# Patient Record
Sex: Male | Born: 1985 | Race: White | Hispanic: No | Marital: Single | Smoking: Never smoker
Health system: Southern US, Community
[De-identification: ages and names within clinical notes are randomized; demographics above are authoritative.]

## PROBLEM LIST (undated history)

## (undated) DIAGNOSIS — K219 Gastro-esophageal reflux disease without esophagitis: Secondary | ICD-10-CM

## (undated) DIAGNOSIS — M459 Ankylosing spondylitis of unspecified sites in spine: Secondary | ICD-10-CM

## (undated) HISTORY — DX: Ankylosing spondylitis of unspecified sites in spine: M45.9

## (undated) HISTORY — PX: WISDOM TOOTH EXTRACTION: SHX21

---
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
## 2011-08-24 DIAGNOSIS — M549 Dorsalgia, unspecified: Secondary | ICD-10-CM | POA: Insufficient documentation

## 2011-08-24 DIAGNOSIS — R634 Abnormal weight loss: Secondary | ICD-10-CM | POA: Insufficient documentation

## 2013-08-28 DIAGNOSIS — M459 Ankylosing spondylitis of unspecified sites in spine: Secondary | ICD-10-CM | POA: Insufficient documentation

## 2013-08-28 HISTORY — DX: Ankylosing spondylitis of unspecified sites in spine: M45.9

## 2013-09-23 DIAGNOSIS — M76899 Other specified enthesopathies of unspecified lower limb, excluding foot: Secondary | ICD-10-CM | POA: Insufficient documentation

## 2013-11-05 DIAGNOSIS — M25559 Pain in unspecified hip: Secondary | ICD-10-CM | POA: Insufficient documentation

## 2014-01-26 DIAGNOSIS — R1030 Lower abdominal pain, unspecified: Secondary | ICD-10-CM | POA: Insufficient documentation

## 2014-06-11 DIAGNOSIS — M5124 Other intervertebral disc displacement, thoracic region: Secondary | ICD-10-CM | POA: Insufficient documentation

## 2018-06-26 ENCOUNTER — Emergency Department (HOSPITAL_COMMUNITY)
Admission: EM | Admit: 2018-06-26 | Discharge: 2018-06-26 | Disposition: A | Discharge status: Home / Self Care | Payer: Self-pay | Attending: Emergency Medicine | Admitting: Emergency Medicine

## 2018-06-26 ENCOUNTER — Other Ambulatory Visit: Payer: Self-pay

## 2018-06-26 ENCOUNTER — Emergency Department (HOSPITAL_COMMUNITY): Payer: Self-pay

## 2018-06-26 ENCOUNTER — Encounter (HOSPITAL_COMMUNITY): Payer: Self-pay | Admitting: Emergency Medicine

## 2018-06-26 DIAGNOSIS — S61312A Laceration without foreign body of right middle finger with damage to nail, initial encounter: Secondary | ICD-10-CM | POA: Insufficient documentation

## 2018-06-26 DIAGNOSIS — Y998 Other external cause status: Secondary | ICD-10-CM | POA: Insufficient documentation

## 2018-06-26 DIAGNOSIS — Y929 Unspecified place or not applicable: Secondary | ICD-10-CM | POA: Insufficient documentation

## 2018-06-26 DIAGNOSIS — Y9389 Activity, other specified: Secondary | ICD-10-CM | POA: Insufficient documentation

## 2018-06-26 DIAGNOSIS — W312XXA Contact with powered woodworking and forming machines, initial encounter: Secondary | ICD-10-CM | POA: Insufficient documentation

## 2018-06-26 MED ORDER — BACITRACIN ZINC 500 UNIT/GM EX OINT
TOPICAL_OINTMENT | CUTANEOUS | Status: AC
  Filled 2018-06-26: qty 0.9

## 2018-06-26 MED ORDER — MUPIROCIN CALCIUM 2 % EX CREA
TOPICAL_CREAM | Freq: Once | CUTANEOUS | Status: DC
  Filled 2018-06-26: qty 15

## 2018-06-26 MED ORDER — MUPIROCIN CALCIUM 2 % EX CREA: 1.0000 "application " | TOPICAL_CREAM | Freq: Every day | CUTANEOUS | 0 refills | Status: DC

## 2018-06-26 MED ORDER — BACITRACIN ZINC 500 UNIT/GM EX OINT: 1.0000 "application " | TOPICAL_OINTMENT | Freq: Every day | CUTANEOUS | 0 refills | Status: DC

## 2018-06-26 MED ORDER — LIDOCAINE-EPINEPHRINE (PF) 2 %-1:200000 IJ SOLN
10.0000 mL | Freq: Once | INTRAMUSCULAR | Status: AC
  Administered 2018-06-26: 10 mL
  Filled 2018-06-26: qty 20

## 2018-06-26 MED ORDER — BACITRACIN ZINC 500 UNIT/GM EX OINT
TOPICAL_OINTMENT | Freq: Once | CUTANEOUS | Status: AC
  Administered 2018-06-26: 18:00:00 via TOPICAL

## 2018-06-26 MED ORDER — CEPHALEXIN 500 MG PO CAPS: 500.0000 mg | ORAL_CAPSULE | Freq: Two times a day (BID) | ORAL | 0 refills | Status: AC

## 2018-06-26 NOTE — Discharge Instructions (Addendum)
Take antibiotics as prescribed. Take the entire course, even if your symptoms improve.  Wash the wound daily with soap and water. Apply new dressing after washing.,  Use antibiotics ointment when applying a dressing.  Use tylenol or ibuprofen as needed for pain.  Follow up with the hand doctor as needed.  Return to the ER with any new or concerning symptoms.

## 2018-06-26 NOTE — ED Triage Notes (Signed)
Patient here from work with complaints of right middle finger laceration. Bleeding controlled. Reports that he has had a tetanus shot.

## 2018-06-26 NOTE — ED Provider Notes (Signed)
[facility]-EMERGENCY DEPT Provider Note   CSN: [id number] Arrival date & time: 06/26/18  1614     History   Chief Complaint Chief Complaint  Patient presents with  . Laceration    HPI Johnathan Armstrong is a 32 y.o. male presenting for evaluation of finger injury.  Table saw, thought it was turned off and he reached to grab for something, it slipped and cut his right dorsal middle finger.  He this happened approximately an hour prior to arrival.  Minimal bleeding or pain.  She denies numbness or tingling.  No injury elsewhere.  He has no medical problems, takes medications daily.  He is not on blood thinners.  Tetanus is up-to-date.  HPI  History reviewed. No pertinent past medical history.  There are no active problems to display for this patient.   History reviewed. No pertinent surgical history.      Home Medications    Prior to Admission medications   Medication Sig Start Date End Date Taking? Authorizing Provider  acetaminophen (TYLENOL) 500 MG tablet Take 1,000 mg by mouth daily as needed (pain).   Yes [provider]  ibuprofen (ADVIL,MOTRIN) 200 MG tablet Take 400 mg by mouth daily as needed (pain).   Yes [provider]  bacitracin ointment Apply 1 application topically daily. 06/26/18   Ardel Jagger, PA-C  cephALEXin (KEFLEX) 500 MG capsule Take 1 capsule (500 mg total) by mouth 2 (two) times daily for 5 days. 06/26/18 07/01/18  Andra Heslin, PA-C    Family History No family history on file.  Social History Social History   Tobacco Use  . Smoking status: Never Smoker  . Smokeless tobacco: Never Used  Substance Use Topics  . Alcohol use: Never    Frequency: Never  . Drug use: Not on file     Allergies   Patient has no allergy information on record.   Review of Systems Review of Systems  Skin: Positive for wound.  Neurological: Negative for numbness.  Hematological: Does not bruise/bleed easily.      Physical Exam Updated Vital Signs BP (!) 147/93 (BP Location: Left Arm)   Pulse 97   Temp 98 F (36.7 C) (Oral)   Ht 5\' 10"  (1.778 m)   Wt 83.9 kg (185 lb)   SpO2 99%   BMI 26.54 kg/m   Physical Exam  Constitutional: He is oriented to person, place, and time. He appears well-developed and well-nourished. No distress.  HENT:  Head: Normocephalic and atraumatic.  Eyes: EOM are normal.  Neck: Normal range of motion.  Pulmonary/Chest: Effort normal.  Abdominal: He exhibits no distension.  Musculoskeletal: Normal range of motion.  Strength of middle finger intact against resistance.  Full active range of motion of the finger.  Two-point discrimination intact.  Neurological: He is alert and oriented to person, place, and time. No sensory deficit.  Skin: Skin is warm. No rash noted.  Laceration of the dorsal aspect of the right middle finger with nail involvement.  Minimal bleeding.  See picture below  Psychiatric: He has a normal mood and affect.  Nursing note and vitals reviewed.      ED Treatments / Results  Labs (all labs ordered are listed, but only abnormal results are displayed) Labs Reviewed - No data to display  EKG None  Radiology Dg Finger Middle Right  Result Date: 06/26/2018 CLINICAL DATA:  Patient status post right long finger laceration today. Initial encounter. EXAM: RIGHT MIDDLE FINGER 2+V COMPARISON:  None.  FINDINGS: There is no evidence of fracture or dislocation. There is no evidence of arthropathy or other focal bone abnormality. Soft tissues are unremarkable. IMPRESSION: Negative exam. Electronically Signed   By: Drusilla Kannerhomas  Dalessio M.D.   On: 06/26/2018 16:59    Procedures .Nail Removal Date/Time: 06/26/2018 6:15 PM Performed by: Alveria Apleyaccavale, Marwah Disbro, PA-C Authorized by: Alveria Apleyaccavale, Lakshya Mcgillicuddy, PA-C   Consent:    Consent obtained:  Verbal   Consent given by:  Patient   Risks discussed:  Bleeding, incomplete removal, infection, pain and permanent nail  deformity Location:    Hand:  R long finger Pre-procedure details:    Skin preparation:  ChloraPrep   Preparation: Patient was prepped and draped in the usual sterile fashion   Anesthesia (see MAR for exact dosages):    Anesthesia method:  Nerve block   Block location:  Digital block   Block needle gauge:  25 G   Block anesthetic:  Lidocaine 2% WITH epi   Block injection procedure:  Anatomic landmarks identified, introduced needle, incremental injection and negative aspiration for blood   Block outcome:  Anesthesia achieved Nail Removal:    Nail removed:  Complete   Nail bed repaired: yes     Nail bed suture material: 5-0 vicryl rapid.   Number of sutures:  3   Removed nail replaced and anchored: no   Post-procedure details:    Dressing:  Antibiotic ointment and Xeroform gauze   Patient tolerance of procedure:  Tolerated well, no immediate complications   (including critical care time)  Medications Ordered in ED Medications  mupirocin cream (BACTROBAN) 2 % (has no administration in time range)  bacitracin 500 UNIT/GM ointment (has no administration in time range)  bacitracin ointment (has no administration in time range)  lidocaine-EPINEPHrine (XYLOCAINE W/EPI) 2 %-1:200000 (PF) injection 10 mL (10 mLs Infiltration Given 06/26/18 1709)     Initial Impression / Assessment and Plan / ED Course  I have reviewed the triage vital signs and the nursing notes.  Pertinent labs & imaging results that were available during my care of the patient were reviewed by me and considered in my medical decision making (see chart for details).     Patient presenting for evaluation of finger injury.  Physical exam shows patient is neurovascularly intact.  X-ray reviewed and interpreted by me, no bony involvement.  Obvious nailbed involvement and deformity.  Nail removed, and nailbed repaired.  Aftercare instructions given.  Follow-up with hand.  Will give antibiotics for prophylaxis.  At this time,  patient appears safe for discharge.  Return precautions given.  Patient states he understands and agrees to plan.   Final Clinical Impressions(s) / ED Diagnoses   Final diagnoses:  Laceration of right middle finger without foreign body with damage to nail, initial encounter    ED Discharge Orders        Ordered    cephALEXin (KEFLEX) 500 MG capsule  2 times daily     06/26/18 1801    mupirocin cream (BACTROBAN) 2 %  Daily,   Status:  Discontinued     06/26/18 1805    bacitracin ointment  Daily     06/26/18 1807       Faisal Stradling, PA-C 06/26/18 1818    Benjiman CorePickering, Nathan, MD 06/27/18 [phone]

## 2018-08-13 ENCOUNTER — Other Ambulatory Visit: Payer: Self-pay

## 2018-08-13 ENCOUNTER — Encounter: Payer: Self-pay | Admitting: Family Medicine

## 2018-08-13 ENCOUNTER — Ambulatory Visit: Payer: Self-pay | Admitting: Family Medicine

## 2018-08-13 VITALS — BP 126/82 | HR 73 | Temp 98.6°F | Ht 70.0 in | Wt 195.6 lb

## 2018-08-13 DIAGNOSIS — J40 Bronchitis, not specified as acute or chronic: Secondary | ICD-10-CM

## 2018-08-13 DIAGNOSIS — J329 Chronic sinusitis, unspecified: Secondary | ICD-10-CM | POA: Insufficient documentation

## 2018-08-13 MED ORDER — ALBUTEROL SULFATE HFA 108 (90 BASE) MCG/ACT IN AERS
2.0000 | INHALATION_SPRAY | Freq: Four times a day (QID) | RESPIRATORY_TRACT | 0 refills | Status: DC | PRN
Start: 2018-08-13 — End: 2019-02-28

## 2018-08-13 MED ORDER — AMOXICILLIN 500 MG PO CAPS: 500.0000 mg | ORAL_CAPSULE | Freq: Three times a day (TID) | ORAL | 0 refills | Status: DC

## 2018-08-13 MED ORDER — PREDNISONE 20 MG PO TABS: 40.0000 mg | ORAL_TABLET | Freq: Every day | ORAL | 0 refills | Status: AC

## 2018-08-13 NOTE — Addendum Note (Signed)
Addended by: Mammie LorenzoMATTHEWS, Joshwa Hemric E on: 08/13/2018 02:15 PM   Modules accepted: Level of Service

## 2018-08-13 NOTE — Assessment & Plan Note (Signed)
Prolonged symptoms, will treat with amoxicillin.  He will d/c cephalexin Rx for prednisone and albuterol inhaler.  He will let me know if he continues to have worsening symptoms.

## 2018-08-13 NOTE — Patient Instructions (Signed)
How to Use a Metered Dose Inhaler A metered dose inhaler is a handheld device for taking medicine that must be breathed into the lungs (inhaled). The device can be used to deliver a variety of inhaled medicines, including:  Quick relief or rescue medicines, such as bronchodilators.  Controller medicines, such as corticosteroids.  The medicine is delivered by pushing down on a metal canister to release a preset amount of spray and medicine. Each device contains the amount of medicine that is needed for a preset number of uses (inhalations). Your health care provider may recommend that you use a spacer with your inhaler to help you take the medicine more effectively. A spacer is a plastic tube with a mouthpiece on one end and an opening that connects to the inhaler on the other end. A spacer holds the medicine in a tube for a short time, which allows you to inhale more medicine. What are the risks? If you do not use your inhaler correctly, medicine might not reach your lungs to help you breathe. Inhaler medicine can cause side effects, such as:  Mouth or throat infection.  Cough.  Hoarseness.  Headache.  Nausea and vomiting.  Lung infection (pneumonia) in people who have a lung condition called COPD.  How to use a metered dose inhaler without a spacer 1. Remove the cap from the inhaler. 2. If you are using the inhaler for the first time, shake it for 5 seconds, turn it away from your face, then release 4 puffs into the air. This is called priming. 3. Shake the inhaler for 5 seconds. 4. Position the inhaler so the top of the canister faces up. 5. Put your index finger on the top of the medicine canister. Support the bottom of the inhaler with your thumb. 6. Breathe out normally and as completely as possible, away from the inhaler. 7. Either place the inhaler between your teeth and close your lips tightly around the mouthpiece, or hold the inhaler 1-2 inches (2.5-5 cm) away from your open  mouth. Keep your tongue down out of the way. If you are unsure which technique to use, ask your health care provider. 8. Press the canister down with your index finger to release the medicine, then inhale deeply and slowly through your mouth (not your nose) until your lungs are completely filled. Inhaling should take 4-6 seconds. 9. Hold the medicine in your lungs for 5-10 seconds (10 seconds is best). This helps the medicine get into the small airways of your lungs. 10. With your lips in a tight circle (pursed), breathe out slowly. 11. Repeat steps 3-10 until you have taken the number of puffs that your health care provider directed. Wait about 1 minute between puffs or as directed. 12. Put the cap on the inhaler. 13. If you are using a steroid inhaler, rinse your mouth with water, gargle, and spit out the water. Do not swallow the water. How to use a metered dose inhaler with a spacer 1. Remove the cap from the inhaler. 2. If you are using the inhaler for the first time, shake it for 5 seconds, turn it away from your face, then release 4 puffs into the air. This is called priming. 3. Shake the inhaler for 5 seconds. 4. Place the open end of the spacer onto the inhaler mouthpiece. 5. Position the inhaler so the top of the canister faces up and the spacer mouthpiece faces you. 6. Put your index finger on the top of the medicine canister.   Support the bottom of the inhaler and the spacer with your thumb. 7. Breathe out normally and as completely as possible, away from the spacer. 8. Place the spacer between your teeth and close your lips tightly around it. Keep your tongue down out of the way. 9. Press the canister down with your index finger to release the medicine, then inhale deeply and slowly through your mouth (not your nose) until your lungs are completely filled. Inhaling should take 4-6 seconds. 10. Hold the medicine in your lungs for 5-10 seconds (10 seconds is best). This helps the medicine  get into the small airways of your lungs. 11. With your lips in a tight circle (pursed), breathe out slowly. 12. Repeat steps 3-11 until you have taken the number of puffs that your health care provider directed. Wait about 1 minute between puffs or as directed. 13. Remove the spacer from the inhaler and put the cap on the inhaler. 14. If you are using a steroid inhaler, rinse your mouth with water, gargle, and spit out the water. Do not swallow the water. Follow these instructions at home:  Take your inhaled medicine only as told by your health care provider. Do not use the inhaler more than directed by your health care provider.  Keep all follow-up visits as told by your health care provider. This is important.  If your inhaler has a counter, you can check it to determine how full your inhaler is. If your inhaler does not have a counter, ask your health care provider when you will need to refill your inhaler and write the refill date on a calendar or on your inhaler canister. Note that you cannot know when an inhaler is empty by shaking it.  Follow directions on the package insert for care and cleaning of your inhaler and spacer. Contact a health care provider if:  Symptoms are only partially relieved with your inhaler.  You are having trouble using your inhaler.  You have an increase in phlegm.  You have headaches. Get help right away if:  You feel little or no relief after using your inhaler.  You have dizziness.  You have a fast heart rate.  You have chills or a fever.  You have night sweats.  There is blood in your phlegm. Summary  A metered dose inhaler is a handheld device for taking medicine that must be breathed into the lungs (inhaled).  The medicine is delivered by pushing down on a metal canister to release a preset amount of spray and medicine.  Each device contains the amount of medicine that is needed for a preset number of uses (inhalations). This  information is not intended to replace advice given to you by your health care provider. Make sure you discuss any questions you have with your health care provider. Document Released: 12/12/2005 Document Revised: 11/01/2016 Document Reviewed: 11/01/2016 Elsevier Interactive Patient Education  2017 [employer]  

## 2018-08-13 NOTE — Progress Notes (Signed)
Johnathan CutterMatthew B Mccarrell - 32 y.o. male MRN [id number]  Date of birth: May 20, 1986  Subjective Chief Complaint  Patient presents with  . Nasal Congestion    [place] ears were irrigated,but today they are sore headache, sometimes SOB    HPI Johnathan Armstrong is a 32 y.o. male here today with complaint of nasal congestion, sinus pressure, intermittent shortness of breath with wheezing and headache.  Symptoms began a little over 1 week ago. Seen previously at an urgent care while on vacation and diagnosed with cerumen impaction, ears were irrigated however still had some occlusion.  He has not been using debrox regularly.  He is unable to blow anything out or cough anything up.  He has not had fever, chills, vision change, nausea or vomiting, or myalgias.  He did start keflex which was prescribed for a finger injury in hopes that this would help his symptoms.  He is not taking anything else at this time.   ROS:  A comprehensive ROS was completed and negative except as noted per HPI  No Known Allergies  Past Medical History:  Diagnosis Date  . Ankylosing spondylitis (HCC) 08/28/2013    History reviewed. No pertinent surgical history.  Social History   Socioeconomic History  . Marital status: Single    Spouse name: Not on file  . Number of children: Not on file  . Years of education: Not on file  . Highest education level: Not on file  Occupational History  . Not on file  Social Needs  . Financial resource strain: Not on file  . Food insecurity:    Worry: Not on file    Inability: Not on file  . Transportation needs:    Medical: Not on file    Non-medical: Not on file  Tobacco Use  . Smoking status: Never Smoker  . Smokeless tobacco: Never Used  Substance and Sexual Activity  . Alcohol use: Never    Frequency: Never  . Drug use: Not on file  . Sexual activity: Not on file  Lifestyle  . Physical activity:    Days per week: Not on file    Minutes  per session: Not on file  . Stress: Not on file  Relationships  . Social connections:    Talks on phone: Not on file    Gets together: Not on file    Attends religious service: Not on file    Active member of club or organization: Not on file    Attends meetings of clubs or organizations: Not on file    Relationship status: Not on file  Other Topics Concern  . Not on file  Social History Narrative  . Not on file    History reviewed. No pertinent family history.  Health Maintenance  Topic Date Due  . HIV Screening  07/12/2000  . INFLUENZA VACCINE  07/26/2018  . TETANUS/TDAP  08/28/2024    ----------------------------------------------------------------------------------------------------------------------------------------------------------------------------------------------------------------- Physical Exam BP 126/82 (BP Location: Left Arm, Patient Position: Sitting, Cuff Size: Normal)   Pulse 73   Temp 98.6 F (37 C) (Oral)   Ht 5\' 10"  (1.778 m)   Wt 195 lb 9.6 oz (88.7 kg)   SpO2 97%   BMI 28.07 kg/m   Physical Exam  Constitutional: He is oriented to person, place, and time. He appears well-nourished. No distress.  HENT:  Head: Normocephalic and atraumatic.  Mouth/Throat: Oropharynx is clear and moist.  Mild maxillary sinus tenderness  EAC impacted with  dark, dry cerumen.   Eyes: No scleral icterus.  Neck: Neck supple. No thyromegaly present.  Cardiovascular: Normal rate, regular rhythm and normal heart sounds.  Pulmonary/Chest: Effort normal and breath sounds normal.  Musculoskeletal: He exhibits no edema.  Lymphadenopathy:    He has no cervical adenopathy.  Neurological: He is alert and oriented to person, place, and time. No cranial nerve deficit.  Skin: No rash noted.  Psychiatric: He has a normal mood and affect. His behavior is normal.     ------------------------------------------------------------------------------------------------------------------------------------------------------------------------------------------------------------------- Assessment and Plan  Sinobronchitis Prolonged symptoms, will treat with amoxicillin.  He will d/c cephalexin Rx for prednisone and albuterol inhaler.  He will let me know if he continues to have worsening symptoms.

## 2018-09-13 ENCOUNTER — Encounter: Payer: Self-pay | Admitting: Family Medicine

## 2019-02-28 ENCOUNTER — Ambulatory Visit (INDEPENDENT_AMBULATORY_CARE_PROVIDER_SITE_OTHER): Payer: PRIVATE HEALTH INSURANCE | Admitting: Family Medicine

## 2019-02-28 ENCOUNTER — Encounter: Payer: Self-pay | Admitting: Family Medicine

## 2019-02-28 VITALS — BP 144/78 | HR 75 | Temp 97.6°F | Ht 70.0 in | Wt 193.4 lb

## 2019-02-28 DIAGNOSIS — R002 Palpitations: Secondary | ICD-10-CM | POA: Diagnosis not present

## 2019-02-28 DIAGNOSIS — J0100 Acute maxillary sinusitis, unspecified: Secondary | ICD-10-CM

## 2019-02-28 DIAGNOSIS — R42 Dizziness and giddiness: Secondary | ICD-10-CM | POA: Diagnosis not present

## 2019-02-28 MED ORDER — AMOXICILLIN-POT CLAVULANATE 875-125 MG PO TABS: 1.0000 | ORAL_TABLET | Freq: Two times a day (BID) | ORAL | 0 refills | Status: DC

## 2019-02-28 MED ORDER — FLUTICASONE PROPIONATE 50 MCG/ACT NA SUSP: 2.0000 | Freq: Every day | NASAL | 2 refills | Status: DC

## 2019-02-28 NOTE — Progress Notes (Signed)
Johnathan Armstrong - 33 y.o. male MRN [id number]  Date of birth: 13-Mar-1986  Subjective Chief Complaint  Patient presents with  . Sinusitis    Patient is here today C/O sinus pressure.  Pressure is maxillary and severe in nature. He feels that he can't get a full deep breath.  Non productive cough. Slight dizziness and feelings of being flushed.  Sx started 2-3 days ago.  Denies any body aches. He has been Tx with Afrin for nasal congestion. Had to lay down for pressure in the back of his head and feeling like neck is warm. Urinated 3-4 times in the past hour.    HPI Johnathan Armstrong is a 33 y.o. male here today with complaint of R sided maxillary sinus pressure as well as some dizziness.  Describes dizziness as sensation that room is spinning.  He reports that symptoms started 2-3 days ago and have not improved since that time.  He reports fullness in his R ear and behind his eyes.  He has not fever, chills, chest pain, wheezing, body aches.  He is currently using afrin and has been using for the past week.   He does have sensation of not being able to fully take a deep breath at times as well as sensation of palpitations but this has been going on intermittently for several months.   Admits to some reflux symptoms as well as increased belching.  Also has had tightness in neck for several months.  Denies radiation/weakness into extremities.  Denies increased stress.  He does do a lot of work that requires him to look up and work overhead for long periods.   He was seen at a hospital in [place] for this last year and reports normal EKG and lab testing at that time.   ROS:  A comprehensive ROS was completed and negative except as noted per HPI    No Known Allergies  Past Medical History:  Diagnosis Date  . Ankylosing spondylitis (HCC) 08/28/2013    No past surgical history on file.  Social History   Socioeconomic History  . Marital status: Single    Spouse name: Not on file  . Number of  children: Not on file  . Years of education: Not on file  . Highest education level: Not on file  Occupational History  . Not on file  Social Needs  . Financial resource strain: Not on file  . Food insecurity:    Worry: Not on file    Inability: Not on file  . Transportation needs:    Medical: Not on file    Non-medical: Not on file  Tobacco Use  . Smoking status: Never Smoker  . Smokeless tobacco: Never Used  Substance and Sexual Activity  . Alcohol use: Yes    Frequency: Never  . Drug use: Never  . Sexual activity: Not on file  Lifestyle  . Physical activity:    Days per week: Not on file    Minutes per session: Not on file  . Stress: Not on file  Relationships  . Social connections:    Talks on phone: Not on file    Gets together: Not on file    Attends religious service: Not on file    Active member of club or organization: Not on file    Attends meetings of clubs or organizations: Not on file    Relationship status: Not on file  Other Topics Concern  . Not on file  Social History Narrative  .  Not on file    Family History  Problem Relation Age of Onset  . Cancer Mother   . Hypertension Mother   . Cancer Father   . Depression Sister   . Asthma Maternal Grandmother   . Hyperlipidemia Maternal Grandmother   . Diabetes Maternal Grandmother   . Hypertension Maternal Grandfather   . Hypertension Paternal Grandmother   . Stroke Paternal Grandmother   . Diabetes Paternal Grandfather     Health Maintenance  Topic Date Due  . INFLUENZA VACCINE  03/26/2019 (Originally 07/26/2018)  . HIV Screening  02/28/2020 (Originally 07/12/2000)  . TETANUS/TDAP  08/28/2024    ----------------------------------------------------------------------------------------------------------------------------------------------------------------------------------------------------------------- Physical Exam BP (!) 144/78 (BP Location: Right Arm, Cuff Size: Normal)   Pulse 75   Temp  97.6 F (36.4 C) (Oral)   Ht 5\' 10"  (1.778 m)   Wt 193 lb 6.4 oz (87.7 kg)   SpO2 99%   BMI 27.75 kg/m   Physical Exam Constitutional:      Appearance: Normal appearance.  HENT:     Head: Normocephalic and atraumatic.     Left Ear: Tympanic membrane and ear canal normal.     Ears:     Comments: Serous effusion to R ear     Nose: Congestion present.     Mouth/Throat:     Mouth: Mucous membranes are moist.  Eyes:     General: No scleral icterus. Neck:     Musculoskeletal: Neck supple.  Cardiovascular:     Rate and Rhythm: Normal rate and regular rhythm.     Heart sounds: Normal heart sounds.  Pulmonary:     Effort: Pulmonary effort is normal.     Breath sounds: Normal breath sounds.  Neurological:     General: No focal deficit present.  Psychiatric:        Mood and Affect: Mood normal.        Behavior: Behavior normal.     ------------------------------------------------------------------------------------------------------------------------------------------------------------------------------------------------------------------- Assessment and Plan  Palpitations -Occurs rarely.  -Reports normal EKG last year in ED -Check labs including TSH, CBC and BMP -? Underlying anxiety with muscle tension of neck as well or esophageal spasm with increased reflux belching.  Recommend trial of OTC prilosec initally.  If not improving we can consider H. Pylori testing.    Acute maxillary sinusitis -Start augmentin -Discussed discontinuing afrin at this time -Add on flonase to help with congestion, middle ear effusion.  I think this is likely contributing to his dizziness.

## 2019-02-28 NOTE — Assessment & Plan Note (Signed)
-  Start augmentin -Discussed discontinuing afrin at this time -Add on flonase to help with congestion, middle ear effusion.  I think this is likely contributing to his dizziness.

## 2019-02-28 NOTE — Assessment & Plan Note (Addendum)
-  Occurs rarely.  -Reports normal EKG last year in ED -Check labs including TSH, CBC and BMP -? Underlying anxiety with muscle tension of neck as well or esophageal spasm with increased reflux belching.  Recommend trial of OTC prilosec initally.  If not improving we can consider H. Pylori testing.

## 2019-02-28 NOTE — Patient Instructions (Signed)

## 2019-03-01 LAB — BASIC METABOLIC PANEL
BUN: 16 mg/dL (ref 6–23)
CALCIUM: 10.1 mg/dL (ref 8.4–10.5)
CHLORIDE: 103 meq/L (ref 96–112)
CO2: 25 mEq/L (ref 19–32)
CREATININE: 0.97 mg/dL (ref 0.40–1.50)
GFR: 88.79 mL/min (ref >60.00)
Glucose, Bld: 84 mg/dL (ref 70–99)
Potassium: 3.6 mEq/L (ref 3.5–5.1)
Sodium: 138 mEq/L (ref 135–145)

## 2019-03-01 LAB — CBC
HCT: 44.6 % (ref 39.0–52.0)
Hemoglobin: 15.2 g/dL (ref 13.0–17.0)
MCHC: 34.1 g/dL (ref 30.0–36.0)
MCV: 83.1 fl (ref 78.0–100.0)
Platelets: 254 10*3/uL (ref 150.0–400.0)
RBC: 5.37 Mil/uL (ref 4.22–5.81)
RDW: 12.7 % (ref 11.5–15.5)
WBC: 8.2 10*3/uL (ref 4.0–10.5)

## 2019-03-01 LAB — TSH: TSH: 1.5 u[IU]/mL (ref 0.35–4.50)

## 2019-03-04 ENCOUNTER — Telehealth: Payer: Self-pay

## 2019-03-04 NOTE — Telephone Encounter (Signed)
All labs are normal. 

## 2019-03-04 NOTE — Telephone Encounter (Signed)
Copied from CRM [id number]. Topic: General - Other >> Mar 04, 2019  3:31 PM Floria Raveling A wrote: Reason for CRM: Pt wife called in and would like some one to give pt a call with the results of the lab work pt had done on 3/5  Best number  [phone]

## 2019-03-05 NOTE — Telephone Encounter (Signed)
I spoke with pt about his results and CM he wanted to let you know he has noticed that he woke up with nausea. He states he did eat prior to taking his antibiotic this morning but he did take it with milk last night but unsure if that triggered it.

## 2019-03-05 NOTE — Telephone Encounter (Signed)
Recommend continuing.  Avoid dairy products with antibiotic.  Let me know if symptoms continue.

## 2019-03-06 ENCOUNTER — Encounter: Payer: Self-pay | Admitting: Family Medicine

## 2019-03-06 ENCOUNTER — Ambulatory Visit (INDEPENDENT_AMBULATORY_CARE_PROVIDER_SITE_OTHER): Payer: PRIVATE HEALTH INSURANCE | Admitting: Family Medicine

## 2019-03-06 ENCOUNTER — Other Ambulatory Visit: Payer: Self-pay

## 2019-03-06 VITALS — BP 132/78 | HR 90 | Temp 98.0°F | Ht 70.0 in | Wt 192.0 lb

## 2019-03-06 DIAGNOSIS — R002 Palpitations: Secondary | ICD-10-CM | POA: Diagnosis not present

## 2019-03-06 MED ORDER — LORAZEPAM 0.5 MG PO TABS: 0.5000 mg | ORAL_TABLET | Freq: Two times a day (BID) | ORAL | 1 refills | Status: DC | PRN

## 2019-03-06 NOTE — Progress Notes (Signed)
Johnathan Armstrong - 33 y.o. male MRN [id number]  Date of birth: 04-22-1986  Subjective Chief Complaint  Patient presents with  . Follow-up    pt states he has been having off and on tingling in his arms, and heart racing, and shaky in his body     HPI Johnathan Armstrong is a 33 y.o. male here today with complaint of palpitations/racing heart.  He reports that he has this a few times per week. Has some associated feelings of numbness/tingling in his arms and hands and shaky feeling.  He reports intermittent feelings of dizziness as well.  Some feeling of epigastric pain and nausea. He denies chest pain or pre-syncopal symptoms.  He has not had shortness of breath, palpitations, fever, chills, headache, blood in stool.  Recent labs including TSH, CBC and metabolic panel were negative.  Doesn't think he has felt particularly anxious.    ROS:  A comprehensive ROS was completed and negative except as noted per HPI  No Known Allergies  Past Medical History:  Diagnosis Date  . Ankylosing spondylitis (HCC) 08/28/2013    No past surgical history on file.  Social History   Socioeconomic History  . Marital status: Single    Spouse name: Not on file  . Number of children: Not on file  . Years of education: Not on file  . Highest education level: Not on file  Occupational History  . Not on file  Social Needs  . Financial resource strain: Not on file  . Food insecurity:    Worry: Not on file    Inability: Not on file  . Transportation needs:    Medical: Not on file    Non-medical: Not on file  Tobacco Use  . Smoking status: Never Smoker  . Smokeless tobacco: Never Used  Substance and Sexual Activity  . Alcohol use: Yes    Frequency: Never  . Drug use: Never  . Sexual activity: Not on file  Lifestyle  . Physical activity:    Days per week: Not on file    Minutes per session: Not on file  . Stress: Not on file  Relationships  . Social connections:    Talks on phone: Not on file   Gets together: Not on file    Attends religious service: Not on file    Active member of club or organization: Not on file    Attends meetings of clubs or organizations: Not on file    Relationship status: Not on file  Other Topics Concern  . Not on file  Social History Narrative  . Not on file    Family History  Problem Relation Age of Onset  . Cancer Mother   . Hypertension Mother   . Cancer Father   . Depression Sister   . Asthma Maternal Grandmother   . Hyperlipidemia Maternal Grandmother   . Diabetes Maternal Grandmother   . Hypertension Maternal Grandfather   . Hypertension Paternal Grandmother   . Stroke Paternal Grandmother   . Diabetes Paternal Grandfather     Health Maintenance  Topic Date Due  . INFLUENZA VACCINE  03/26/2019 (Originally 07/26/2018)  . HIV Screening  02/28/2020 (Originally 07/12/2000)  . TETANUS/TDAP  08/28/2024    ----------------------------------------------------------------------------------------------------------------------------------------------------------------------------------------------------------------- Physical Exam BP 132/78   Pulse 90   Temp 98 F (36.7 C) (Oral)   Ht 5\' 10"  (1.778 m)   Wt 192 lb (87.1 kg)   SpO2 98%   BMI 27.55 kg/m   Physical Exam Constitutional:  Appearance: Normal appearance.  HENT:     Head: Normocephalic and atraumatic.     Mouth/Throat:     Mouth: Mucous membranes are moist.  Eyes:     General: No scleral icterus. Neck:     Musculoskeletal: Neck supple.  Cardiovascular:     Rate and Rhythm: Normal rate and regular rhythm.     Heart sounds: Normal heart sounds.  Pulmonary:     Effort: Pulmonary effort is normal.     Breath sounds: Normal breath sounds.  Abdominal:     General: Abdomen is flat. There is no distension.     Palpations: Abdomen is soft.     Tenderness: There is no abdominal tenderness.  Lymphadenopathy:     Cervical: No cervical adenopathy.  Skin:    General: Skin  is warm and dry.  Neurological:     General: No focal deficit present.     Mental Status: He is alert.  Psychiatric:        Mood and Affect: Mood normal.        Behavior: Behavior normal.     ------------------------------------------------------------------------------------------------------------------------------------------------------------------------------------------------------------------- Assessment and Plan  Palpitations -EKG reviewed, incomplete RBBB otherwise normal.   -Symptoms suggestive of panic attack, will give trial of lorazepam -We did discuss referral to cardiology if this continues and no relief with lorazepam.

## 2019-03-06 NOTE — Telephone Encounter (Signed)
Called and spoke with pt to inform him of Dr. Anastasio Auerbach message. Per pt he was going to call us for an appt due to him not feeling better, he states last night his body was throbbing and his bp has been running slightly elevated and he just is really concerned. CM gave verbal ok for pt to come this afternoon for an appt. Pt's appt was scheduled.  Nothing further is needed

## 2019-03-06 NOTE — Patient Instructions (Signed)
Panic Attack  A panic attack is a sudden episode of severe anxiety, fear, or discomfort that causes physical and emotional symptoms. The attack may be in response to something frightening, or it may occur for no known reason.  Symptoms of a panic attack can be similar to symptoms of a heart attack or stroke. It is important to see your health care provider when you have a panic attack so that these conditions can be ruled out.  A panic attack is a symptom of another condition. Most panic attacks go away with treatment of the underlying problem. If you have panic attacks often, you may have a condition called panic disorder.  What are the causes?  A panic attack may be caused by:  · An extreme, life-threatening situation, such as a war or natural disaster.  · An anxiety disorder, such as post-traumatic stress disorder.  · Depression.  · Certain medical conditions, including heart problems, neurological conditions, and infections.  · Certain over-the-counter and prescription medicines.  · Illegal drugs that increase heart rate and blood pressure, such as methamphetamine.  · Alcohol.  · Supplements that increase anxiety.  · Panic disorder.  What increases the risk?  You are more likely to develop this condition if:  · You have an anxiety disorder.  · You have another mental health condition.  · You take certain medicines.  · You use alcohol, illegal drugs, or other substances.  · You are under extreme stress.  · A life event is causing increased feelings of anxiety and depression.  What are the signs or symptoms?  A panic attack starts suddenly, usually lasts about 20 minutes, and occurs with one or more of the following:  · A pounding heart.  · A feeling that your heart is beating irregularly or faster than normal (palpitations).  · Sweating.  · Trembling or shaking.  · Shortness of breath or feeling smothered.  · Feeling choked.  · Chest pain or discomfort.  · Nausea or a strange feeling in your  stomach.  · Dizziness, feeling lightheaded, or feeling like you might faint.  · Chills or hot flashes.  · Numbness or tingling in your lips, hands, or feet.  · Feeling confused, or feeling that you are not yourself.  · Fear of losing control or being emotionally unstable.  · Fear of dying.  How is this diagnosed?  A panic attack is diagnosed with an assessment by your health care provider. During the assessment your health care provider will ask questions about:  · Your history of anxiety, depression, and panic attacks.  · Your medical history.  · Whether you drink alcohol, use illegal drugs, take supplements, or take medicines. Be honest about your substance use.  Your health care provider may also:  · Order blood tests or other kinds of tests to rule out serious medical conditions.  · Refer you to a mental health professional for further evaluation.  How is this treated?  Treatment depends on the cause of the panic attack:  · If the cause is a medical problem, your health care provider will either treat that problem or refer you to a specialist.  · If the cause is emotional, you may be given anti-anxiety medicines or referred to a counselor. These medicines may reduce how often attacks happen, reduce how severe the attacks are, and lower anxiety.  · If the cause is a medicine, your health care provider may tell you to stop the medicine, change your dose, or   take a different medicine.  · If the cause is a drug, treatment may involve letting the drug wear off and taking medicine to help the drug leave your body or to counteract its effects. Attacks caused by drug abuse may continue even if you stop using the drug.  Follow these instructions at home:  · Take over-the-counter and prescription medicines only as told by your health care provider.  · If you feel anxious, limit your caffeine intake.  · Take good care of your physical and mental health by:  ? Eating a balanced diet that includes plenty of fresh fruits and  vegetables, whole grains, lean meats, and low-fat dairy.  ? Getting plenty of rest. Try to get 7-8 hours of uninterrupted sleep each night.  ? Exercising regularly. Try to get 30 minutes of physical activity at least 5 days a week.  ? Not smoking. Talk to your health care provider if you need help quitting.  ? Limiting alcohol intake to no more than 1 drink a day for nonpregnant women and 2 drinks a day for men. One drink equals 12 oz of beer, 5 oz of wine, or 1½ oz of hard liquor.  · Keep all follow-up visits as told by your health care provider. This is important. Panic attacks may have underlying physical or emotional problems that take time to accurately diagnose.  Contact a health care provider if:  · Your symptoms do not improve, or they get worse.  · You are not able to take your medicine as prescribed because of side effects.  Get help right away if:  · You have serious thoughts about hurting yourself or others.  · You have symptoms of a panic attack. Do not drive yourself to the hospital. Have someone else drive you or call an ambulance.  If you ever feel like you may hurt yourself or others, or you have thoughts about taking your own life, get help right away. You can go to your nearest emergency department or call:  · Your local emergency services (911 in the [place]  · A suicide crisis helpline, such as the National Suicide Prevention Lifeline at 1-800-273-8255. This is open 24 hours a day.  Summary  · A panic attack is a sign of a serious health or mental health condition. Get help right away. Do not drive yourself to the hospital. Have someone else drive you or call an ambulance.  · Always see a health care provider to have the reasons for the panic attack correctly diagnosed.  · If your panic attack was caused by a physical problem, follow your health care provider's suggestions for medicine, referral to a specialist, and lifestyle changes.  · If your panic attack was caused by an emotional problem,  follow through with counseling from a qualified mental health specialist.  · If you feel like you may hurt yourself or others, call 911 and get help right away.  This information is not intended to replace advice given to you by your health care provider. Make sure you discuss any questions you have with your health care provider.  Document Released: 12/12/2005 Document Revised: 01/20/2017 Document Reviewed: 01/20/2017  Elsevier Interactive Patient Education © 2019 [employer]

## 2019-03-07 ENCOUNTER — Telehealth: Payer: Self-pay | Admitting: Family Medicine

## 2019-03-07 DIAGNOSIS — R002 Palpitations: Secondary | ICD-10-CM

## 2019-03-07 NOTE — Telephone Encounter (Signed)
Copied from CRM [id number]. Topic: Referral - Request for Referral >> Mar 07, 2019  1:30 PM Maia Petties wrote: Has patient seen PCP for this complaint? Yes.   *If NO, is insurance requiring patient see PCP for this issue before PCP can refer them? Referral for which specialty: Cardiology Preferred provider/office: pt said where recommended Reason for referral: pt said he discussed during OV 03/06/2019 with Dr. Matthews/palpatations

## 2019-03-07 NOTE — Telephone Encounter (Signed)
Orders entered

## 2019-03-08 ENCOUNTER — Other Ambulatory Visit: Payer: Self-pay

## 2019-03-08 ENCOUNTER — Other Ambulatory Visit (INDEPENDENT_AMBULATORY_CARE_PROVIDER_SITE_OTHER): Payer: PRIVATE HEALTH INSURANCE

## 2019-03-08 ENCOUNTER — Other Ambulatory Visit: Payer: Self-pay | Admitting: Family Medicine

## 2019-03-08 ENCOUNTER — Telehealth: Payer: Self-pay

## 2019-03-08 ENCOUNTER — Telehealth: Payer: Self-pay | Admitting: Family Medicine

## 2019-03-08 DIAGNOSIS — K219 Gastro-esophageal reflux disease without esophagitis: Secondary | ICD-10-CM | POA: Diagnosis not present

## 2019-03-08 NOTE — Telephone Encounter (Signed)
Pt will come get the kit from the lab.

## 2019-03-08 NOTE — Telephone Encounter (Signed)
We can do this here, confirmed we have kits.  Orders entered.  Can come in for lab visit to have this completed.

## 2019-03-08 NOTE — Telephone Encounter (Signed)
spoke with patient about not eating and/or drinking anything prior to coming in (1hr) for [employer] test. Also told him that he would need to stay for at least 15-20 minutes for this test.

## 2019-03-08 NOTE — Telephone Encounter (Signed)
Patient came by asking about gettingo a H-pylori test done here. He is not sure if he could get it done here. Please advise. Please give patient a call at [phone]

## 2019-03-10 ENCOUNTER — Encounter: Payer: Self-pay | Admitting: Family Medicine

## 2019-03-10 NOTE — Assessment & Plan Note (Signed)
-  EKG reviewed, incomplete RBBB otherwise normal.   -Symptoms suggestive of panic attack, will give trial of lorazepam -We did discuss referral to cardiology if this continues and no relief with lorazepam.

## 2019-03-11 LAB — H. PYLORI BREATH TEST: H. PYLORI BREATH TEST: NOT DETECTED

## 2019-03-12 ENCOUNTER — Telehealth: Payer: Self-pay | Admitting: Family Medicine

## 2019-03-12 NOTE — Telephone Encounter (Signed)
Copied from CRM [id number]. Topic: Quick Communication - See Telephone Encounter >> Mar 12, 2019  4:27 PM Arlyss Gandy, NT wrote: CRM for notification. See Telephone encounter for: 03/12/19. Pt would like a call to discuss his EKG test results.

## 2019-03-13 NOTE — Telephone Encounter (Signed)
Reviewed EKG from [facility] and current EKG appears unchanged compared to previous EKG.  What other questions does he have?

## 2019-03-13 NOTE — Telephone Encounter (Signed)
Called Pt. Test results were not listed. "Just got an email for EKG , but no results" , Gave results, Pt had npo further questions.

## 2019-03-31 ENCOUNTER — Encounter: Payer: Self-pay | Admitting: Family Medicine

## 2019-04-03 ENCOUNTER — Ambulatory Visit (INDEPENDENT_AMBULATORY_CARE_PROVIDER_SITE_OTHER): Payer: PRIVATE HEALTH INSURANCE | Admitting: Family Medicine

## 2019-04-03 ENCOUNTER — Encounter: Payer: Self-pay | Admitting: Family Medicine

## 2019-04-03 VITALS — BP 121/80 | HR 65 | Temp 97.6°F | Wt 189.0 lb

## 2019-04-03 DIAGNOSIS — M542 Cervicalgia: Secondary | ICD-10-CM

## 2019-04-03 DIAGNOSIS — R002 Palpitations: Secondary | ICD-10-CM

## 2019-04-03 DIAGNOSIS — R0789 Other chest pain: Secondary | ICD-10-CM | POA: Diagnosis not present

## 2019-04-03 NOTE — Progress Notes (Signed)
Johnathan Armstrong - 33 y.o. male MRN [id number]  Date of birth: 1986-09-05   This visit type was conducted due to national recommendations for restrictions regarding the COVID-19 Pandemic (e.g. social distancing).  This format is felt to be most appropriate for this patient at this time.  All issues noted in this document were discussed and addressed.  No physical exam was performed (except for noted visual exam findings with Video Visits).  I discussed the limitations of evaluation and management by telemedicine and the availability of in person appointments. The patient expressed understanding and agreed to proceed.  I connected with@ on 04/03/19 at  1:30 PM EDT by a video enabled telemedicine application and verified that I am speaking with the correct person using two identifiers.   Patient Location: Home [address] [place] [address]   Provider location:   Home Office  Chief Complaint  Patient presents with  . Follow-up    HTN, pain in shoulders/upper back -    HPI  Johnathan Armstrong is a 33 y.o. male who presents via audio/video conferencing for a telehealth visit today.  He is following up for palpitations and chest tightness.  He reports that he continue to have episodes of this.  Once again describes as intermittent feeling of palpitations and also having chest tightness that radiates around collar bones.  Also describes sensation of numbness and tingling into arms.  This is worse while sleeping.  He denies any weakness.  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 He does also have a history of ankylosing spondylitis however denies any other joint swelling or stiffness.   He still has sensation of palpitations at times however thinks this may be related to spasm as well.    ROS:  A comprehensive ROS was completed and negative except as noted per HPI  Past Medical History:  Diagnosis Date  . Ankylosing spondylitis (HCC) 08/28/2013    No past surgical  history on file.  Family History  Problem Relation Age of Onset  . Cancer Mother   . Hypertension Mother   . Cancer Father   . Depression Sister   . Asthma Maternal Grandmother   . Hyperlipidemia Maternal Grandmother   . Diabetes Maternal Grandmother   . Hypertension Maternal Grandfather   . Hypertension Paternal Grandmother   . Stroke Paternal Grandmother   . Diabetes Paternal Grandfather     Social History   Socioeconomic History  . Marital status: Single    Spouse name: Not on file  . Number of children: Not on file  . Years of education: Not on file  . Highest education level: Not on file  Occupational History  . Not on file  Social Needs  . Financial resource strain: Not on file  . Food insecurity:    Worry: Not on file    Inability: Not on file  . Transportation needs:    Medical: Not on file    Non-medical: Not on file  Tobacco Use  . Smoking status: Never Smoker  . Smokeless tobacco: Never Used  Substance and Sexual Activity  . Alcohol use: Yes    Frequency: Never  . Drug use: Never  . Sexual activity: Not on file  Lifestyle  . Physical activity:    Days per week: Not on file    Minutes per session: Not on file  . Stress: Not on file  Relationships  . Social connections:    Talks on phone: Not  on file    Gets together: Not on file    Attends religious service: Not on file    Active member of club or organization: Not on file    Attends meetings of clubs or organizations: Not on file    Relationship status: Not on file  . Intimate partner violence:    Fear of current or ex partner: Not on file    Emotionally abused: Not on file    Physically abused: Not on file    Forced sexual activity: Not on file  Other Topics Concern  . Not on file  Social History Narrative  . Not on file     Current Outpatient Medications:  .  fluticasone (FLONASE) 50 MCG/ACT nasal spray, Place 2 sprays into both nostrils daily., Disp: 16 g, Rfl: 2 .  loratadine  (CLARITIN) 10 MG tablet, Take 10 mg by mouth daily., Disp: , Rfl:  .  LORazepam (ATIVAN) 0.5 MG tablet, Take 1 tablet (0.5 mg total) by mouth 2 (two) times daily as needed for anxiety., Disp: 15 tablet, Rfl: 1 .  amoxicillin-clavulanate (AUGMENTIN) 875-125 MG tablet, Take 1 tablet by mouth 2 (two) times daily. (Patient not taking: Reported on 04/03/2019), Disp: 20 tablet, Rfl: 0  EXAM:  VITALS per patient if applicable: BP 121/80 Comment: Pt reports from home  Pulse 65 Comment: pt reports from hm  Temp 97.6 F (36.4 C) (Oral) Comment: pt reports from hm  Wt 189 lb (85.7 kg) Comment: Pt reports from home  BMI 27.12 kg/m   GENERAL: alert, oriented, appears well and in no acute distress  HEENT: atraumatic, conjunttiva clear, no obvious abnormalities on inspection of external nose and ears  NECK: normal movements of the head and neck  LUNGS: on inspection no signs of respiratory distress, breathing rate appears normal, no obvious gross SOB, gasping or wheezing  CV: no obvious cyanosis  MS: moves all visible extremities without noticeable abnormality  PSYCH/NEURO: pleasant and cooperative, no obvious depression or anxiety, speech and thought processing grossly intact  ASSESSMENT AND PLAN:  Discussed the following assessment and plan:  Chest wall pain -Continues to have pain in back, shoulders and chest wall.  Reports having CT of neck recently at outside hospital, will request records. Differential also includes thoracic outlet syndrome although does not seem to have vascular symptom that would go along with this.  -Also with history of thoracic disc disease that may be contributing.  -No joint swelling to suggest additional auto-immune process.  -Discussed nerve conduction study as well once records reviewed.    Palpitations -Stable.  Ekg reassuring.  Has been referred to cardiology.    >25 minutes spent with patient with 50% of time spent counseling patient and coordination of  care regarding his ongoing symptoms as outlined above.     I discussed the assessment and treatment plan with the patient. The patient was provided an opportunity to ask questions and all were answered. The patient agreed with the plan and demonstrated an understanding of the instructions.   The patient was advised to call back or seek an in-person evaluation if the symptoms worsen or if the condition fails to improve as anticipated.     Everrett Coombeody Sherrelle Prochazka, DO

## 2019-04-03 NOTE — Assessment & Plan Note (Addendum)
-  Continues to have pain in back, shoulders and chest wall.  Reports having CT of neck recently at outside hospital, will request records. Differential also includes thoracic outlet syndrome although does not seem to have vascular symptom that would go along with this.  -Also with history of thoracic disc disease that may be contributing.  -No joint swelling to suggest additional auto-immune process.  -Discussed nerve conduction study as well once records reviewed.

## 2019-04-03 NOTE — Assessment & Plan Note (Signed)
-  Stable.  Ekg reassuring.  Has been referred to cardiology.

## 2019-04-04 NOTE — Progress Notes (Signed)
[facility] Records [phone], Fx [phone]) . Called Pt to get fax number to fax [phone]) over EMR release form . Waiting on return fax sheet from Pt. Will fax medical records to request CT of chest/C-spine.

## 2019-04-08 ENCOUNTER — Encounter: Payer: Self-pay | Admitting: Cardiovascular Disease

## 2019-04-08 ENCOUNTER — Telehealth (INDEPENDENT_AMBULATORY_CARE_PROVIDER_SITE_OTHER): Payer: PRIVATE HEALTH INSURANCE | Admitting: Cardiovascular Disease

## 2019-04-08 ENCOUNTER — Telehealth: Payer: Self-pay | Admitting: Cardiovascular Disease

## 2019-04-08 ENCOUNTER — Other Ambulatory Visit: Payer: Self-pay

## 2019-04-08 DIAGNOSIS — R0789 Other chest pain: Secondary | ICD-10-CM

## 2019-04-08 DIAGNOSIS — R002 Palpitations: Secondary | ICD-10-CM

## 2019-04-08 DIAGNOSIS — R0609 Other forms of dyspnea: Secondary | ICD-10-CM

## 2019-04-08 NOTE — Patient Instructions (Addendum)
Medication Instructions:  Your physician recommends that you continue on your current medications as directed. Please refer to the Current Medication list given to you today.  Increase your intake of fluids (water with electrolyte tabs like Nun tablets, or gatorade) , protein ( hard boiled eggs, chicken, fish) , and a electrolytes ( V-8 juice, salt, potassium chloride  which is sold as No-Salt   Lab work: None Ordered   Testing/Procedures: None Ordered   Follow-Up: At [facility], you and your health needs are our priority.  As part of our continuing mission to provide you with exceptional heart care, we have created designated Provider Care Teams.  These Care Teams include your primary Cardiologist (physician) and Advanced Practice Providers (APPs -  Physician Assistants and Nurse Practitioners) who all work together to provide you with the care you need, when you need it. You will need a follow up appointment in:  3 months on Wednesda August 19 at 9:00 am with Dr. Elease Hashimoto. You may also see one of the following Advanced Practice Providers on your designated Care Team in the future: Tereso Newcomer, PA-C [employer], PA-C . Berton Bon, NP

## 2019-04-08 NOTE — Telephone Encounter (Signed)
Spoke to the patient and scheduled a Virtual Visit (Video/Doximity) with Dr Elease HashimotoNahser today 4/13 @ 3:20 pm.  Patient verbalized understanding and verbalized consent.  YOUR CARDIOLOGY TEAM HAS ARRANGED FOR AN E-VISIT FOR YOUR APPOINTMENT - PLEASE REVIEW IMPORTANT INFORMATION BELOW SEVERAL DAYS PRIOR TO YOUR APPOINTMENT  Due to the recent COVID-19 pandemic, we are transitioning in-person office visits to tele-medicine visits in an effort to decrease unnecessary exposure to our patients and staff. Medicare and most insurances are covering these visits without a copay needed. We also encourage you to sign up for MyChart if you have not already done so. You will need a smartphone if possible. For patients that do not have this, we can still complete the visit using a regular telephone but do prefer a smartphone to enable video when possible. You may have a close family member that lives with you that can help. If possible, we also ask that you have a blood pressure cuff and scale at home to measure your blood pressure, heart rate and weight prior to your scheduled appointment. Patients with clinical needs that need an in-person evaluation and testing will still be able to come to the office if absolutely necessary. If you have any questions, feel free to call our office.       Our staff will also make sure you have reviewed the consent and agree to move forward with your scheduled tele-health visit.     THE DAY OF YOUR APPOINTMENT  Approximately 15 minutes prior to your scheduled appointment, you will receive a telephone call from one of [facility] team - your caller ID may say "Unknown caller."  Our staff will confirm medications, vital signs for the day and any symptoms you may be experiencing. Please have this information available prior to the time of visit start. It may also be helpful for you to have a pad of paper and pen handy for any instructions given during your visit.   CONSENT FOR TELE-HEALTH  VISIT - PLEASE REVIEW  I hereby voluntarily request, consent and authorize [facility] and its employed or contracted physicians, physician assistants, nurse practitioners or other licensed health care professionals (the Practitioner), to provide me with telemedicine health care services (the "Services") as deemed necessary by the treating Practitioner. I acknowledge and consent to receive the Services by the Practitioner via telemedicine. I understand that the telemedicine visit will involve communicating with the Practitioner through live audiovisual communication technology and the disclosure of certain medical information by electronic transmission. I acknowledge that I have been given the opportunity to request an in-person assessment or other available alternative prior to the telemedicine visit and am voluntarily participating in the telemedicine visit.  I understand that I have the right to withhold or withdraw my consent to the use of telemedicine in the course of my care at any time, without affecting my right to future care or treatment, and that the Practitioner or I may terminate the telemedicine visit at any time. I understand that I have the right to inspect all information obtained and/or recorded in the course of the telemedicine visit and may receive copies of available information for a reasonable fee.  I understand that some of the potential risks of receiving the Services via telemedicine include:  Marland Kitchen. Delay or interruption in medical evaluation due to technological equipment failure or disruption; . Information transmitted may not be sufficient (e.g. poor resolution of images) to allow for appropriate medical decision making by the Practitioner; and/or  . In  rare instances, security protocols could fail, causing a breach of personal health information.  Furthermore, I acknowledge that it is my responsibility to provide information about my medical history, conditions and care that is  complete and accurate to the best of my ability. I acknowledge that Practitioner's advice, recommendations, and/or decision may be based on factors not within their control, such as incomplete or inaccurate data provided by me or distortions of diagnostic images or specimens that may result from electronic transmissions. I understand that the practice of medicine is not an exact science and that Practitioner makes no warranties or guarantees regarding treatment outcomes. I acknowledge that I will receive a copy of this consent concurrently upon execution via email to the email address I last provided but may also request a printed copy by calling the office of [facility].    I understand that my insurance will be billed for this visit.   I have read or had this consent read to me. . I understand the contents of this consent, which adequately explains the benefits and risks of the Services being provided via telemedicine.  . I have been provided ample opportunity to ask questions regarding this consent and the Services and have had my questions answered to my satisfaction. . I give my informed consent for the services to be provided through the use of telemedicine in my medical care  By participating in this telemedicine visit I agree to the above.

## 2019-04-08 NOTE — Telephone Encounter (Signed)
I spoke to the patient who called because he is feeling a heaviness in his chest, radiating to left elbow.  He is also SOB at times.  He has pain between the shoulder blades and is experiencing a stiff neck.     He just has "an uneasy upper body feeling."    His hands go numb while laying in bed at night.  His vital signs have been stable HR 70 BP 120/80.  I have referred to Dr Elease Hashimoto and Marcelino Duster for further advice.

## 2019-04-08 NOTE — Telephone Encounter (Signed)
New Message    Pt says his symptoms are coming back and he feels a tingling in his finger tips, has a heavy chest.  He said he woke up last night with his left and numb   Please call

## 2019-04-08 NOTE — Progress Notes (Signed)
Virtual Visit via Video Note   This visit type was conducted due to national recommendations for restrictions regarding the COVID-19 Pandemic (e.g. social distancing) in an effort to limit this patient's exposure and mitigate transmission in our community.  Due to his co-morbid illnesses, this patient is at least at moderate risk for complications without adequate follow up.  This format is felt to be most appropriate for this patient at this time.  All issues noted in this document were discussed and addressed.  A limited physical exam was performed with this format.  Please refer to the patient's chart for his consent to telehealth for [facility].   Evaluation Performed:  Follow-up visit  Date:  04/08/2019   ID:  Johnathan CutterMatthew B Williamson, DOB 23-Jul-1986, MRN [id number]  Patient Location: Home  Provider Location: Office  PCP:  Everrett CoombeMatthews, Cody, DO  Cardiologist:  No primary care provider on file.  Electrophysiologist:  None   Chief Complaint:  Chest heaviness   History of Present Illness:    Johnathan Armstrong is a 33 y.o. male who presents via audio/video conferencing for a telehealth visit today.    Molli Armstrong is a 33 yo who we are "seeing" for the first time today via [employer] web visit for further evaluation of chest tightness / chest pain   Has multiple symptoms . Cp is frequently present.   Fullness in his chest .  Feels the need to take a deep breath.  Difficult to fill his lungs.    Limited range of motion in his shoulders.  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    The pain occurs after work while driving or in the early morning . Has some numbness in his hands while sleeping. Has neck pain , goes to a chiropractor   Also has some palpitations - HR does not change But feels like his heart is beating hard.   Stays hydrated while on the job - but has cut back because his urine is clear  Eats  well.   3 eggs in the am.   Lunch is oatmeal and PB and yogart. Chicken and veg at night .     Palpitations occur sporatically during the day  Last for a second Last potassium is 3.6   [facility]( [place]  For these same symptoms.   Was not told of any abnormalities.   Drinks a glass of wine or couple of beers typically Symptoms of palpitations will worsen if he drinks more ETOH on the weekends.    The patient does not have symptoms concerning for COVID-19 infection (fever, chills, cough, or new shortness of breath).    Past Medical History:  Diagnosis Date  . Ankylosing spondylitis (HCC) 08/28/2013   No past surgical history on file.   Current Meds  Medication Sig  . fluticasone (FLONASE) 50 MCG/ACT nasal spray Place 2 sprays into both nostrils daily.  Marland Kitchen. loratadine (CLARITIN) 10 MG tablet Take 10 mg by mouth daily.  Marland Kitchen. LORazepam (ATIVAN) 0.5 MG tablet Take 1 tablet (0.5 mg total) by mouth 2 (two) times daily as needed for anxiety.     Allergies:   Patient has no known allergies.   Social History   Tobacco Use  . Smoking status: Never Smoker  . Smokeless tobacco: Never Used  Substance Use Topics  .  Alcohol use: Yes    Frequency: Never  . Drug use: Never     Family Hx: The patient's family history includes Asthma in his maternal grandmother; Cancer in his father and mother; Depression in his sister; Diabetes in his maternal grandmother and paternal grandfather; Hyperlipidemia in his maternal grandmother; Hypertension in his maternal grandfather, mother, and paternal grandmother; Stroke in his paternal grandmother.  Several Grandparents  had a CVAs  ROS:   Please see the history of present illness.     All other systems reviewed and are negative.   Prior CV studies:   The following studies were reviewed today:    Labs/Other Tests and Data Reviewed:    EKG:  An ECG dated 03-06-19  was personally reviewed today and  demonstrated:  NSR at 72.   Inc. RBBB .    Recent Labs: 02/28/2019: BUN 16; Creatinine, Ser 0.97; Hemoglobin 15.2; Platelets 254.0; Potassium 3.6; Sodium 138; TSH 1.50   Recent Lipid Panel No results found for: CHOL, TRIG, HDL, CHOLHDL, LDLCALC, LDLDIRECT  Wt Readings from Last 3 Encounters:  04/08/19 189 lb (85.7 kg)  04/03/19 189 lb (85.7 kg)  03/06/19 192 lb (87.1 kg)     Objective:    Vital Signs:  BP 136/79 (BP Location: Left Arm, Patient Position: Sitting, Cuff Size: Normal)   Pulse 70   Ht 5\' 10"  (1.778 m)   Wt 189 lb (85.7 kg)   BMI 27.12 kg/m     General:   Appears healthy,   NAD HEENT:   No obvious JVD or lymphadenopathy Resp:   Normal work of breathing,   resp rate is normal  CV :   BP and HR are normal ,  No edema Abd:   No abdomina swelling , Ext:   No clubbing, cyanosis, or edema  Neuro:   Alert and oriented x 3.   No obvious motor deficits Skin : no obvious rashes    ASSESSMENT & PLAN:    1. Chest heaviness: Sound like MSK pain   Has difficulty in taking a deep breath .   Dos not sound like a cardiac issue   2.  Palpitations: Palpitations occur every other day . palps will last a few seconds Rec.  V-8 every day  I suspect these are worsened by relative volume depletion or electrolyte depletion Adding protein and electrolytes should help   3.   Chest tightness:  Sound like as MSK issue Ive referred him to Denice Paradise who is a phycial therapist who can help him with strengthening exercises.  Ive given him Aaron's number   COVID-19 Education: The signs and symptoms of COVID-19 were discussed with the patient and how to seek care for testing (follow up with PCP or arrange E-visit).  The importance of social distancing was discussed today.  Time:   Today, I have spent  35  minutes with the patient with telehealth technology discussing the above problems.     Medication Adjustments/Labs and Tests Ordered: Current medicines are reviewed at length with  the patient today.  Concerns regarding medicines are outlined above.  Tests Ordered: No orders of the defined types were placed in this encounter.   Medication Changes: No orders of the defined types were placed in this encounter.   Disposition:  Follow up in 3 month(s)  Signed, Kristeen Miss, MD  04/08/2019 3:19 PM    [facility]

## 2019-04-15 ENCOUNTER — Encounter: Payer: Self-pay | Admitting: Family Medicine

## 2019-04-22 ENCOUNTER — Ambulatory Visit: Payer: PRIVATE HEALTH INSURANCE | Admitting: Cardiovascular Disease

## 2019-04-22 ENCOUNTER — Telehealth: Payer: PRIVATE HEALTH INSURANCE | Admitting: Cardiovascular Disease

## 2019-05-14 ENCOUNTER — Encounter: Payer: Self-pay | Admitting: Family Medicine

## 2019-05-14 DIAGNOSIS — K219 Gastro-esophageal reflux disease without esophagitis: Secondary | ICD-10-CM

## 2019-05-17 NOTE — Telephone Encounter (Signed)
Let's have him see GI, please enter referral for dx: GERD

## 2019-05-21 ENCOUNTER — Ambulatory Visit: Payer: PRIVATE HEALTH INSURANCE | Admitting: Cardiology

## 2019-05-22 ENCOUNTER — Encounter: Payer: Self-pay | Admitting: Gastroenterology

## 2019-05-22 ENCOUNTER — Telehealth (INDEPENDENT_AMBULATORY_CARE_PROVIDER_SITE_OTHER): Payer: PRIVATE HEALTH INSURANCE | Admitting: Gastroenterology

## 2019-05-22 ENCOUNTER — Other Ambulatory Visit: Payer: Self-pay

## 2019-05-22 VITALS — Ht 70.0 in | Wt 190.0 lb

## 2019-05-22 DIAGNOSIS — K219 Gastro-esophageal reflux disease without esophagitis: Secondary | ICD-10-CM | POA: Diagnosis not present

## 2019-05-22 MED ORDER — PANTOPRAZOLE SODIUM 40 MG PO TBEC: 40.0000 mg | DELAYED_RELEASE_TABLET | Freq: Every day | ORAL | 6 refills | Status: DC

## 2019-05-22 NOTE — Progress Notes (Signed)
Chief Complaint: Reflux  Referring Provider:  Everrett CoombeMatthews, Cody, DO      ASSESSMENT AND PLAN;   #1. GERD/noncardiac chest pains. Nl TSH, HP breath test 02/2019. Neg cardiac eval 03/2019  #2. Globus sensation.  Plan: -Trial of Protonix 40 mg p.o. QAM, 1/2hr before breakfast (#30, 6 refills). -If no improvement in 2 weeks, proceed with EGD.  I have discussed risks and benefits. -He is to call us in 2 weeks to let us know how he is doing. -Follow-up in 4 weeks.   HPI:    Johnathan Armstrong is a 33 y.o. male  Very nice guy With chest tightness, occasional heartburn and globus sensation x few months, getting worse. Denies having any odynophagia or dysphagia Has been taking Tums and Pepto-Bismol with some relief. No nausea, vomiting, regurgitation, odynophagia or dysphagia.  No significant diarrhea or constipation.  There is no melena or hematochezia. No unintentional weight loss. Occasional nonsteroidals but not frequently. Denies excessive use of sodas, chocolates, mints No abdominal pain.  Had cardiac evaluation by Dr. Valente DavidNahser-likely noncardiac chest pains.   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 Ferdie Pingarpenter, Friend of Connye BurkittSteve Hamilton. [place] in [place]. Past Medical History:  Diagnosis Date  . Ankylosing spondylitis (HCC) 08/28/2013    History reviewed. No pertinent surgical history.  Family History  Problem Relation Age of Onset  . Hypertension Mother   . Breast cancer Mother   . Prostate cancer Father   . Lymphoma Father   . Depression Sister   . Asthma Maternal Grandmother   . Hyperlipidemia Maternal Grandmother   . Diabetes Maternal Grandmother   . Hypertension Maternal Grandfather   . Hypertension Paternal Grandmother   . Stroke Paternal Grandmother   . Diabetes Paternal Grandfather   . Colon cancer Neg Hx   . Esophageal cancer Neg Hx     Social History   Tobacco Use  . Smoking status: Never Smoker  . Smokeless tobacco: Former NeurosurgeonUser  . Tobacco  comment: have done chewing  Substance Use Topics  . Alcohol use: Yes    Frequency: Never  . Drug use: Never    Current Outpatient Medications  Medication Sig Dispense Refill  . fluticasone (FLONASE) 50 MCG/ACT nasal spray Place 2 sprays into both nostrils daily. 16 g 2  . loratadine (CLARITIN) 10 MG tablet Take 10 mg by mouth daily.    Marland Kitchen. LORazepam (ATIVAN) 0.5 MG tablet Take 1 tablet (0.5 mg total) by mouth 2 (two) times daily as needed for anxiety. (Patient not taking: Reported on 05/22/2019) 15 tablet 1   No current facility-administered medications for this visit.     No Known Allergies  Review of Systems:  Constitutional: Denies fever, chills, diaphoresis, appetite change and fatigue.  HEENT: Has Sinus prob. Respiratory: Denies SOB, DOE, cough, chest tightness,  and wheezing.   Cardiovascular: Denies chest pain, has chest discomfort and palpitations. No leg swelling.  Genitourinary: Denies dysuria, urgency, frequency, hematuria, flank pain and difficulty urinating.  Musculoskeletal: Denies myalgias, back pain, joint swelling, arthralgias and gait problem.  Skin: No rash.  Neurological: Denies dizziness, seizures, syncope, weakness, light-headedness, numbness and headaches.  Hematological: Denies adenopathy. Easy bruising, personal or family bleeding history  Psychiatric/Behavioral: No anxiety or depression     Physical Exam:    Ht 5\' 10"  (1.778 m)   Wt 190 lb (86.2 kg)   BMI 27.26 kg/m  Filed Weights   05/22/19 0817  Weight: 190 lb (86.2 kg)   Constitutional:  Well-developed,  in no acute distress. Psychiatric: Normal mood and affect. Behavior is normal. Tele visit  Data Reviewed: I have personally reviewed following labs and imaging studies  CBC: CBC Latest Ref Rng & Units 02/28/2019  WBC 4.0 - 10.5 K/uL 8.2  Hemoglobin 13.0 - 17.0 g/dL 61.2  Hematocrit 24.4 - 52.0 % 44.6  Platelets 150.0 - 400.0 K/uL 254.0    CMP: CMP Latest Ref Rng & Units 02/28/2019   Glucose 70 - 99 mg/dL 84  BUN 6 - 23 mg/dL 16  Creatinine 9.75 - 3.00 mg/dL 5.11  Sodium 021 - 117 mEq/L 138  Potassium 3.5 - 5.1 mEq/L 3.6  Chloride 96 - 112 mEq/L 103  CO2 19 - 32 mEq/L 25  Calcium 8.4 - 10.5 mg/dL 35.6  This service was provided via telemedicine.  The patient was located at office.  The provider was located in office.  The patient did consent to this telephone visit and is aware of possible charges through their insurance for this visit.  The patient was referred by Dr Everrett Coombe.    Time spent on call/coordination of care: 20 min     Edman Circle, MD 05/22/2019, 8:42 AM  Cc: Everrett Coombe, DO

## 2019-05-22 NOTE — Patient Instructions (Signed)
If you are age 34 or older, your body mass index should be between 23-30. Your Body mass index is 27.26 kg/m. If this is out of the aforementioned range listed, please consider follow up with your Primary Care Provider.  If you are age 50 or younger, your body mass index should be between 19-25. Your Body mass index is 27.26 kg/m. If this is out of the aformentioned range listed, please consider follow up with your Primary Care Provider.   We have sent the following medications to your pharmacy for you to pick up at your convenience: Protonix   Please call Dr. Donne Hazel nurse Veronia Beets, RN)  in 2 weeks at [phone]  to let her now how you are doing.   Follow up 4 weeks.   Thank you,  Dr. Lynann Bologna

## 2019-06-17 ENCOUNTER — Encounter: Payer: Self-pay | Admitting: Family Medicine

## 2019-08-14 ENCOUNTER — Ambulatory Visit (INDEPENDENT_AMBULATORY_CARE_PROVIDER_SITE_OTHER): Payer: PRIVATE HEALTH INSURANCE | Admitting: Cardiovascular Disease

## 2019-08-14 ENCOUNTER — Encounter: Payer: Self-pay | Admitting: Cardiovascular Disease

## 2019-08-14 DIAGNOSIS — R0789 Other chest pain: Secondary | ICD-10-CM

## 2019-08-14 NOTE — Progress Notes (Signed)
Pt did not show up for his office visit

## 2019-08-15 ENCOUNTER — Encounter: Payer: Self-pay | Admitting: Cardiovascular Disease

## 2019-08-15 NOTE — Progress Notes (Signed)
This encounter was created in error - please disregard.

## 2020-01-31 ENCOUNTER — Telehealth (INDEPENDENT_AMBULATORY_CARE_PROVIDER_SITE_OTHER): Payer: PRIVATE HEALTH INSURANCE | Admitting: Nurse Practitioner

## 2020-01-31 ENCOUNTER — Other Ambulatory Visit: Payer: Self-pay

## 2020-01-31 ENCOUNTER — Encounter: Payer: Self-pay | Admitting: Nurse Practitioner

## 2020-01-31 VITALS — HR 63 | Ht 70.0 in

## 2020-01-31 DIAGNOSIS — J029 Acute pharyngitis, unspecified: Secondary | ICD-10-CM | POA: Diagnosis not present

## 2020-01-31 MED ORDER — PREDNISONE 20 MG PO TABS: 40.0000 mg | ORAL_TABLET | Freq: Every day | ORAL | 0 refills | Status: DC

## 2020-01-31 MED ORDER — AMOXICILLIN 875 MG PO TABS: 875.0000 mg | ORAL_TABLET | Freq: Two times a day (BID) | ORAL | 0 refills | Status: DC

## 2020-01-31 NOTE — Patient Instructions (Signed)
Uvulitis  Uvulitis is inflammation of the uvula. The uvula is the small, finger-like piece of tissue that hangs down at the back of the throat. Uvulitis causes swelling and soreness of the uvula. It can also cause other symptoms, depending on the cause and severity of the condition. What are the causes? This condition may be caused by:  An infection in the mouth or throat. This is the most common cause.  Trauma or injury to the uvula. Causes of trauma include burning your mouth, damage from a medical procedure, and heavy snoring.  Fluid buildup (edema). Edema can be triggered by an allergic reaction. Uvulitis that is caused by edema is called Quincke disease.  Inhaling chemicals, smoke, steam, or other substances that irritate the body. What are the signs or symptoms? Symptoms of this condition depend on the cause. Symptoms of uvulitis that is caused by infection include:  Red, swollen uvula.  Sore throat.  Fever.  Headache.  Swollen neck glands. Symptoms of uvulitis that is caused by trauma, edema, or irritation include:  Red, swollen uvula.  Sore throat.  Trouble swallowing.  Choking or gagging.  Trouble breathing. How is this diagnosed? This condition is diagnosed with a physical exam. You may also have tests, such as a throat culture or blood tests, to help find the cause of the condition. How is this treated? Treatment for this condition depends on the cause. Treatment may involve:  Antibiotic medicine for a bacterial infection.  Steroid medicine if the condition is caused by edema.  Surgery to remove part of the uvula (partial uvulectomy). Surgery is only needed in rare cases where the swelling does not go away after trying other treatments. Follow these instructions at home:  Medicines  Take over-the-counter and prescription medicines only as told by your health care provider.  If you were prescribed an antibiotic medicine, take it as told by your health  care provider. Do not stop taking the antibiotic even if you start to feel better. Managing pain and soreness   Use a cool-mist humidifier to add moisture to the air. This can help ease irritation in your throat.  While your throat is sore: ? Eat a diet of soft foods or liquids. ? Gargle with a salt-water mixture 3-4 times a day or as needed. To make a salt-water mixture, completely dissolve -1 tsp of salt in 1 cup of warm water. General instructions  Drink enough fluid to keep your urine pale yellow.  Keep all follow-up visits as told by your health care provider. This is important. Contact a health care provider if:  You have a fever.  You have trouble eating.  Your symptoms do not get better.  Your symptoms come back after treatment. Get help right away if:  You have trouble breathing.  You have trouble swallowing. Summary  Uvulitis is inflammation of the uvula, which is the small, finger-like piece of tissue that hangs down at the back of the throat.  Uvulitis causes swelling and soreness of the uvula.  This condition may be treated with antibiotics or steroids. In rare cases, surgery may be needed. This information is not intended to replace advice given to you by your health care provider. Make sure you discuss any questions you have with your health care provider. Document Revised: 03/29/2018 Document Reviewed: 03/29/2018 Elsevier Patient Education  2020 [employer].

## 2020-01-31 NOTE — Progress Notes (Signed)
Virtual Visit via Video Note  I connected with@ on 01/31/20 at 10:00 AM EST by a video enabled telemedicine application and verified that I am speaking with the correct person using two identifiers.  Location: Patient:Home Provider: Office Participants: patient and provider  I discussed the limitations of evaluation and management by telemedicine and the availability of in person appointments. I also discussed with the patient that there may be a patient responsible charge related to this service. The patient expressed understanding and agreed to proceed.  CC:pt c/o of swollen uvula,righ side neck and back of the head pain,tender to touch,stiffness/going on 6 days ago but uvula swelling notice last night/FYI--pt had runny nose but might be from working outside all week.   History of Present Illness: Sore Throat  This is a new problem. The current episode started in the past 7 days. The problem has been waxing and waning. The pain is worse on the right side. There has been no fever. The pain is moderate. Associated symptoms include congestion and neck pain. Pertinent negatives include no coughing, diarrhea, drooling, ear discharge, ear pain, headaches, hoarse voice, plugged ear sensation, shortness of breath, stridor, swollen glands, trouble swallowing or vomiting. He has had no exposure to strep or mono. He has tried NSAIDs for the symptoms. The treatment provided moderate relief.   Observations/Objective: Physical Exam  Constitutional: He is oriented to person, place, and time. No distress.  HENT:  Right Ear: External ear normal.  Left Ear: External ear normal.  Nose: Right sinus exhibits no maxillary sinus tenderness and no frontal sinus tenderness. Left sinus exhibits no maxillary sinus tenderness and no frontal sinus tenderness.  Mouth/Throat: Uvula is midline. No oral lesions. No trismus in the jaw. Uvula swelling present. Posterior oropharyngeal edema and posterior oropharyngeal  erythema present. No oropharyngeal exudate or tonsillar abscesses.  Pulmonary/Chest: Effort normal.  Musculoskeletal:     Cervical back: Normal range of motion and neck supple.  Neurological: He is alert and oriented to person, place, and time.  Clear voice   Assessment and Plan: Johnathan Armstrong was seen today for sore throat.  Diagnoses and all orders for this visit:  Acute pharyngitis, unspecified etiology -     predniSONE (DELTASONE) 20 MG tablet; Take 2 tablets (40 mg total) by mouth daily with breakfast. -     amoxicillin (AMOXIL) 875 MG tablet; Take 1 tablet (875 mg total) by mouth 2 (two) times daily.   Follow Up Instructions: See avs   I discussed the assessment and treatment plan with the patient. The patient was provided an opportunity to ask questions and all were answered. The patient agreed with the plan and demonstrated an understanding of the instructions.   The patient was advised to call back or seek an in-person evaluation if the symptoms worsen or if the condition fails to improve as anticipated.   Alysia Penna, NP

## 2020-02-18 DIAGNOSIS — K219 Gastro-esophageal reflux disease without esophagitis: Secondary | ICD-10-CM

## 2020-02-19 NOTE — Telephone Encounter (Signed)
Increase Protonix 40 mg p.o. twice daily, 60, 6 refills If not better in 2 weeks, we should proceed with EGD followed by ENT consultation if needed Reflux diet  RG

## 2020-02-20 MED ORDER — PANTOPRAZOLE SODIUM 40 MG PO TBEC: 40.0000 mg | DELAYED_RELEASE_TABLET | Freq: Two times a day (BID) | ORAL | 6 refills | Status: DC

## 2020-02-20 NOTE — Telephone Encounter (Signed)
Called and spoke with patient-patient informed of MD recommendations; patient is agreeable with plan of care and verified pharmacy;  RX sent to pharmacy; Patient was advised to call the office at [phone] if questions/concerns arise;  Patient verbalized understanding of information/instructions; patient understands to call back in 2 weeks if symptoms not improved;

## 2021-05-04 ENCOUNTER — Emergency Department (HOSPITAL_BASED_OUTPATIENT_CLINIC_OR_DEPARTMENT_OTHER)
Admission: EM | Admit: 2021-05-04 | Discharge: 2021-05-04 | Disposition: A | Discharge status: Home / Self Care | Payer: PRIVATE HEALTH INSURANCE | Attending: Emergency Medicine | Admitting: Emergency Medicine

## 2021-05-04 ENCOUNTER — Encounter (HOSPITAL_BASED_OUTPATIENT_CLINIC_OR_DEPARTMENT_OTHER): Payer: Self-pay

## 2021-05-04 ENCOUNTER — Emergency Department (HOSPITAL_BASED_OUTPATIENT_CLINIC_OR_DEPARTMENT_OTHER): Payer: PRIVATE HEALTH INSURANCE

## 2021-05-04 ENCOUNTER — Other Ambulatory Visit: Payer: Self-pay

## 2021-05-04 DIAGNOSIS — Z87891 Personal history of nicotine dependence: Secondary | ICD-10-CM | POA: Insufficient documentation

## 2021-05-04 DIAGNOSIS — R072 Precordial pain: Secondary | ICD-10-CM | POA: Insufficient documentation

## 2021-05-04 DIAGNOSIS — R0789 Other chest pain: Secondary | ICD-10-CM

## 2021-05-04 HISTORY — DX: Gastro-esophageal reflux disease without esophagitis: K21.9

## 2021-05-04 LAB — CBC
HCT: 44 % (ref 39.0–52.0)
Hemoglobin: 14.9 g/dL (ref 13.0–17.0)
MCH: 28.5 pg (ref 26.0–34.0)
MCHC: 33.9 g/dL (ref 30.0–36.0)
MCV: 84.1 fL (ref 80.0–100.0)
Platelets: 273 10*3/uL (ref 150–400)
RBC: 5.23 MIL/uL (ref 4.22–5.81)
RDW: 11.8 % (ref 11.5–15.5)
WBC: 5.4 10*3/uL (ref 4.0–10.5)
nRBC: 0 % (ref 0.0–0.2)

## 2021-05-04 LAB — BASIC METABOLIC PANEL
Anion gap: 10 (ref 5–15)
BUN: 15 mg/dL (ref 6–20)
CO2: 24 mmol/L (ref 22–32)
Calcium: 9.4 mg/dL (ref 8.9–10.3)
Chloride: 105 mmol/L (ref 98–111)
Creatinine, Ser: 1.17 mg/dL (ref 0.61–1.24)
GFR, Estimated: >60 mL/min (ref >60)
Glucose, Bld: 91 mg/dL (ref 70–99)
Potassium: 3.6 mmol/L (ref 3.5–5.1)
Sodium: 139 mmol/L (ref 135–145)

## 2021-05-04 LAB — HEPATIC FUNCTION PANEL
ALT: 21 U/L (ref 0–44)
AST: 24 U/L (ref 15–41)
Albumin: 4.8 g/dL (ref 3.5–5.0)
Alkaline Phosphatase: 67 U/L (ref 38–126)
Bilirubin, Direct: 0.1 mg/dL (ref 0.0–0.2)
Indirect Bilirubin: 0.3 mg/dL (ref 0.3–0.9)
Total Bilirubin: 0.4 mg/dL (ref 0.3–1.2)
Total Protein: 7.9 g/dL (ref 6.5–8.1)

## 2021-05-04 LAB — TROPONIN I (HIGH SENSITIVITY): Troponin I (High Sensitivity): 2 ng/L (ref <18)

## 2021-05-04 MED ORDER — SODIUM CHLORIDE 0.9 % IV SOLN: INTRAVENOUS | Status: DC | PRN

## 2021-05-04 MED ORDER — FAMOTIDINE 20 MG PO TABS: 20.0000 mg | ORAL_TABLET | Freq: Every day | ORAL | 0 refills | Status: AC

## 2021-05-04 MED ORDER — LIDOCAINE VISCOUS HCL 2 % MT SOLN
15.0000 mL | Freq: Once | OROMUCOSAL | Status: AC
  Administered 2021-05-04: 15 mL via ORAL
  Filled 2021-05-04: qty 15

## 2021-05-04 MED ORDER — FAMOTIDINE IN NACL 20-0.9 MG/50ML-% IV SOLN
20.0000 mg | Freq: Once | INTRAVENOUS | Status: AC
  Administered 2021-05-04: 20 mg via INTRAVENOUS
  Filled 2021-05-04: qty 50

## 2021-05-04 MED ORDER — ALUM & MAG HYDROXIDE-SIMETH 200-200-20 MG/5ML PO SUSP
30.0000 mL | Freq: Once | ORAL | Status: AC
  Administered 2021-05-04: 30 mL via ORAL
  Filled 2021-05-04: qty 30

## 2021-05-04 NOTE — Discharge Instructions (Addendum)
Your work-up today was reassuring of a relatively high suspicion for reflux.  You may have a small ulcer in your stomach or the duodenum--the upper part of your small intestines--I recommend starting Pepcid you may take 20 mg once daily every day for the next 2 weeks and follow-up with a primary care doctor in the meantime.  Drink plenty of water.  I recommend refraining from ibuprofen and primarily taking Tylenol for headaches or other aches or pains.  Also recommend abstaining from alcohol.

## 2021-05-04 NOTE — ED Triage Notes (Addendum)
Pt c/o intermitent CP x 4 days-denies fever/flu sx-NAD-steady gait

## 2021-05-04 NOTE — ED Provider Notes (Signed)
[facility] EMERGENCY DEPARTMENT Provider Note   CSN: [id number] Arrival date & time: 05/04/21  1236     History Chief Complaint  Patient presents with  . Chest Pain    Johnathan Armstrong is a 35 y.o. male.  HPI Patient is a 35 year old male with past medical history significant for reflux and questionably ankylosing spondylitis  Patient is presenting today with intermittent left-sided/sternal chest pain that is nonradiating, nonexertional, nonpleuritic.  He states that it has been going on with no aggravating or mitigating factors apart from potentially somewhat worse with eating.  He states he is also belching some and it has somewhat of a burning quality to his chest pain.  He states he is not on any medications for reflux although he has been told he has reflux in the past.  He was seen at urgent care and had chest x-ray done was told to start taking Pepcid however he was also told that they could not rule out a heart issue in the urgent care which prompted him to come to the ER.  He states that his episode today has lasted approximately 4 hours so far.  He states this is not uncommon although it usually occurs for about 15 to 20 minutes.   Patient denies any nausea vomiting fever shortness of breath cough congestion or diaphoresis.  No radiation of pain.     Past Medical History:  Diagnosis Date  . Ankylosing spondylitis (HCC) 08/28/2013  . GERD (gastroesophageal reflux disease)     Patient Active Problem List   Diagnosis Date Noted  . Chest wall pain 04/03/2019  . Dizziness 02/28/2019  . Acute maxillary sinusitis 02/28/2019  . Palpitations 02/28/2019  . Sinobronchitis 08/13/2018  . 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 Groin pain 01/26/2014  . Hip pain 11/05/2013  . Enthesopathy of hip region 09/23/2013  . Ankylosing spondylitis (HCC) 08/28/2013  . Backache 08/24/2011  . Weight loss 08/24/2011    History reviewed.  No pertinent surgical history.     Family History  Problem Relation Age of Onset  . Hypertension Mother   . Breast cancer Mother   . Prostate cancer Father   . Lymphoma Father   . Depression Sister   . Asthma Maternal Grandmother   . Hyperlipidemia Maternal Grandmother   . Diabetes Maternal Grandmother   . Hypertension Maternal Grandfather   . Hypertension Paternal Grandmother   . Stroke Paternal Grandmother   . Diabetes Paternal Grandfather   . Colon cancer Neg Hx   . Esophageal cancer Neg Hx     Social History   Tobacco Use  . Smoking status: Never Smoker  . Smokeless tobacco: Former Clinical biochemist  . Vaping Use: Never used  Substance Use Topics  . Alcohol use: Yes    Comment: occ  . Drug use: Never    Home Medications Prior to Admission medications   Medication Sig Start Date End Date Taking? Authorizing Provider  famotidine (PEPCID) 20 MG tablet Take 1 tablet (20 mg total) by mouth daily. 05/04/21  Yes Carmichael Burdette S, PA  amoxicillin (AMOXIL) 875 MG tablet Take 1 tablet (875 mg total) by mouth 2 (two) times daily. 01/31/20   Nche, Bonna Gains, NP  pantoprazole (PROTONIX) 40 MG tablet Take 1 tablet (40 mg total) by mouth 2 (two) times daily. 02/20/20   Lynann Bologna, MD  predniSONE (DELTASONE) 20 MG tablet Take 2 tablets (40 mg total) by mouth daily with breakfast. 01/31/20  Nche, Bonna Gains, NP    Allergies    Patient has no known allergies.  Review of Systems   Review of Systems  Constitutional: Negative for chills and fever.  HENT: Negative for congestion.   Eyes: Negative for pain.  Respiratory: Negative for cough and shortness of breath.   Cardiovascular: Positive for chest pain. Negative for leg swelling.  Gastrointestinal: Negative for abdominal pain, diarrhea, nausea and vomiting.  Genitourinary: Negative for dysuria.  Musculoskeletal: Negative for myalgias.  Skin: Negative for rash.  Neurological: Negative for dizziness and headaches.     Physical Exam Updated Vital Signs BP 124/90   Pulse 65   Temp 98.2 F (36.8 C) (Oral)   Resp 16   Ht 5\' 10"  (1.778 m)   Wt 81.6 kg   SpO2 100%   BMI 25.83 kg/m   Physical Exam Vitals and nursing note reviewed.  Constitutional:      General: He is not in acute distress.    Comments: Pleasant well-appearing 35 year old.  In no acute distress.  Sitting comfortably in bed.  Able answer questions appropriately follow commands. No increased work of breathing. Speaking in full sentences.  HENT:     Head: Normocephalic and atraumatic.     Nose: Nose normal.  Eyes:     General: No scleral icterus. Cardiovascular:     Rate and Rhythm: Normal rate and regular rhythm.     Pulses: Normal pulses.     Heart sounds: Normal heart sounds.     Comments: Bilateral radial artery pulses 3+ and symmetric Pulmonary:     Effort: Pulmonary effort is normal. No respiratory distress.     Breath sounds: Normal breath sounds. No wheezing.  Abdominal:     Palpations: Abdomen is soft.     Tenderness: There is no abdominal tenderness.  Musculoskeletal:     Cervical back: Normal range of motion.     Right lower leg: No edema.     Left lower leg: No edema.     Comments: No lower extremity edema or calf tenderness  Skin:    General: Skin is warm and dry.     Capillary Refill: Capillary refill takes less than 2 seconds.  Neurological:     Mental Status: He is alert. Mental status is at baseline.  Psychiatric:        Mood and Affect: Mood normal.        Behavior: Behavior normal.     ED Results / Procedures / Treatments   Labs (all labs ordered are listed, but only abnormal results are displayed) Labs Reviewed  BASIC METABOLIC PANEL  CBC  HEPATIC FUNCTION PANEL  TROPONIN I (HIGH SENSITIVITY)  TROPONIN I (HIGH SENSITIVITY)    EKG None  Radiology DG Chest Port 1 View  Result Date: 05/04/2021 CLINICAL DATA:  Provided history: Chest pain. Additional history provided: Patient reports  burning in chest for 2 days. EXAM: PORTABLE CHEST 1 VIEW COMPARISON:  No pertinent prior exams available for comparison. FINDINGS: Heart size within normal limits. No appreciable airspace consolidation. No evidence of pleural effusion or pneumothorax. No acute bony abnormality identified. IMPRESSION: No evidence of active cardiopulmonary disease. Electronically Signed   By: 07/04/2021 DO   On: 05/04/2021 13:38    Procedures Procedures   Medications Ordered in ED Medications  famotidine (PEPCID) IVPB 20 mg premix (20 mg Intravenous New Bag/Given 05/04/21 1327)  0.9 %  sodium chloride infusion ( Intravenous New Bag/Given 05/04/21 1326)  alum & mag hydroxide-simeth (MAALOX/MYLANTA)  200-200-20 MG/5ML suspension 30 mL (30 mLs Oral Given 05/04/21 1318)    And  lidocaine (XYLOCAINE) 2 % viscous mouth solution 15 mL (15 mLs Oral Given 05/04/21 1318)    ED Course  I have reviewed the triage vital signs and the nursing notes.  Pertinent labs & imaging results that were available during my care of the patient were reviewed by me and considered in my medical decision making (see chart for details).    MDM Rules/Calculators/A&P                          Patient is a 35 year old with a past medical history significant for  Reflux presented today for chest pain.  Symptoms have been intermittent for the past 3 days he states he has had relatively constant pain today he states it is burning and central.  He has a history of reflux states that it feels similar was concerned about his heart went to urgent care with told that it most likely is reflux and was instructed to start Pepci however patient was told that he cannot do lab work and cardiac enzymes any urgent care and if he wanted this done would need to go to ER.  Patient is PERC negative.  Well-appearing.  Vital signs within normal limits.  Low heart score.  Non-smoker.  Nondiabetic.  No significant pertinent past medical history.  .  Troponin x1  will be obtained.  There are no abnormalities on physical exam.  CBC without leukocytosis or anemia.  BMP unremarkable.  LFTs unremarkable.  Troponin x1 within normal limits.  Chest x-ray unremarkable.   IMPRESSION:  No evidence of active cardiopulmonary disease.     Troponin x1 within normal limits.  Patient reassessed.  Vital signs within normal limits his mild hypertension has improved.  His tachycardia is also improved after treatment of GI cocktail and Pepcid.    All questions answered to best my ability.  I suspect reflux.  Will discharge with famotidine and close follow-up with PCP return precautions given.   Final Clinical Impression(s) / ED Diagnoses Final diagnoses:  Atypical chest pain    Rx / DC Orders ED Discharge Orders         Ordered    famotidine (PEPCID) 20 MG tablet  Daily        05/04/21 1315           Solon Augusta [place], [place] 05/04/21 1402    Melene Plan, DO 05/04/21 1444

## 2021-05-05 ENCOUNTER — Telehealth: Payer: Self-pay | Admitting: General Practice

## 2021-05-05 NOTE — Telephone Encounter (Signed)
Transition Care Management Follow-up Telephone Call  Date of discharge and from where: [facility]  How have you been since you were released from the hospital? Doing ok. Patient stated that he got a new PCP after Dr. Ashley Royalty left the previous practice he was at.   Any questions or concerns? No  Items Reviewed:  Did the pt receive and understand the discharge instructions provided? Yes   Medications obtained and verified? No   Other? No   Any new allergies since your discharge? No   Dietary orders reviewed? Yes  Do you have support at home? Yes   Home Care and Equipment/Supplies: Were home health services ordered? no   Functional Questionnaire: (I = Independent and D = Dependent) ADLs: I  Bathing/Dressing- I  Meal Prep- I  Eating- I  Maintaining continence- I  Transferring/Ambulation- I  Managing Meds- I  Follow up appointments reviewed:   PCP Hospital f/u appt confirmed? No  Patient stated that he has a new PCP and he will follow up with them.   Specialist Hospital f/u appt confirmed? No    Are transportation arrangements needed? No   If their condition worsens, is the pt aware to call PCP or go to the Emergency Dept.? Yes  Was the patient provided with contact information for the PCP's office or ED? Yes  Was to pt encouraged to call back with questions or concerns? Yes

## 2021-05-07 NOTE — Telephone Encounter (Signed)
Matt, Agree with proceeding with EGD Meanwhile.  Double up on Protonix 40 mg p.o. twice daily.  RG   Linda, Can we set him up for EGD directly at [facility] RG

## 2021-05-11 ENCOUNTER — Ambulatory Visit (AMBULATORY_SURGERY_CENTER): Payer: PRIVATE HEALTH INSURANCE | Admitting: *Deleted

## 2021-05-11 ENCOUNTER — Other Ambulatory Visit: Payer: Self-pay

## 2021-05-11 VITALS — Ht 70.0 in | Wt 180.0 lb

## 2021-05-11 DIAGNOSIS — K219 Gastro-esophageal reflux disease without esophagitis: Secondary | ICD-10-CM

## 2021-05-11 NOTE — Progress Notes (Signed)
Patient's pre-visit was done today over the phone with the patient due to COVID-19 pandemic. Name,DOB and address verified. Insurance verified. Patient denies any allergies to Eggs and Soy.  Patient states he has a sensiivity to eggs as a IDG food panel allergy test, but is able to eat eggs in foods without a reaction.  Patient denies any problems with anesthesia/sedation. Patient denies taking diet pills or blood thinners. Packet of Prep instructions mailed to patient including a copy of a consent form-pt is aware.  Patient understands to call us back with any questions or concerns. Patient is aware of our care-partner policy and Covid-19 safety protocol. EMMI education assigned to the patient for the procedure, sent to MyChart.   Pt requested a sooner appointment for EGD.  Procedure was moved to Wed., 05/19/21 at 3:00.

## 2021-05-14 ENCOUNTER — Encounter: Payer: Self-pay | Admitting: Gastroenterology

## 2021-05-19 ENCOUNTER — Ambulatory Visit (AMBULATORY_SURGERY_CENTER): Payer: Self-pay | Admitting: Gastroenterology

## 2021-05-19 ENCOUNTER — Encounter: Payer: Self-pay | Admitting: Gastroenterology

## 2021-05-19 ENCOUNTER — Other Ambulatory Visit: Payer: Self-pay

## 2021-05-19 VITALS — BP 114/74 | HR 57 | Temp 98.4°F | Resp 11 | Ht 70.0 in | Wt 180.0 lb

## 2021-05-19 DIAGNOSIS — K2970 Gastritis, unspecified, without bleeding: Secondary | ICD-10-CM

## 2021-05-19 DIAGNOSIS — K269 Duodenal ulcer, unspecified as acute or chronic, without hemorrhage or perforation: Secondary | ICD-10-CM

## 2021-05-19 DIAGNOSIS — K219 Gastro-esophageal reflux disease without esophagitis: Secondary | ICD-10-CM

## 2021-05-19 DIAGNOSIS — R1310 Dysphagia, unspecified: Secondary | ICD-10-CM

## 2021-05-19 DIAGNOSIS — K319 Disease of stomach and duodenum, unspecified: Secondary | ICD-10-CM

## 2021-05-19 DIAGNOSIS — K2990 Gastroduodenitis, unspecified, without bleeding: Secondary | ICD-10-CM

## 2021-05-19 MED ORDER — PANTOPRAZOLE SODIUM 40 MG PO TBEC: 40.0000 mg | DELAYED_RELEASE_TABLET | Freq: Every day | ORAL | 3 refills | Status: AC

## 2021-05-19 MED ORDER — SODIUM CHLORIDE 0.9 % IV SOLN: 500.0000 mL | Freq: Once | INTRAVENOUS | Status: DC

## 2021-05-19 NOTE — Progress Notes (Signed)
Pt's states no medical or surgical changes since previsit or office visit. 

## 2021-05-19 NOTE — Progress Notes (Signed)
pt tolerated well. VSS. awake and to recovery. Report given to RN. Bite block left insitu to recovery. 

## 2021-05-19 NOTE — Progress Notes (Signed)
Called to room to assist during endoscopic procedure.  Patient ID and intended procedure confirmed with present staff. Received instructions for my participation in the procedure from the performing physician.  

## 2021-05-19 NOTE — Patient Instructions (Signed)
YOU HAD AN ENDOSCOPIC PROCEDURE TODAY AT THE [facility]:   Refer to the procedure report that was given to you for any specific questions about what was found during the examination.  If the procedure report does not answer your questions, please call your gastroenterologist to clarify.  If you requested that your care partner not be given the details of your procedure findings, then the procedure report has been included in a sealed envelope for you to review at your convenience later.  YOU SHOULD EXPECT: Some feelings of bloating in the abdomen. Passage of more gas than usual.  Walking can help get rid of the air that was put into your GI tract during the procedure and reduce the bloating. If you had a lower endoscopy (such as a colonoscopy or flexible sigmoidoscopy) you may notice spotting of blood in your stool or on the toilet paper. If you underwent a bowel prep for your procedure, you may not have a normal bowel movement for a few days.  Please Note:  You might notice some irritation and congestion in your nose or some drainage.  This is from the oxygen used during your procedure.  There is no need for concern and it should clear up in a day or so.  SYMPTOMS TO REPORT IMMEDIATELY:   Following lower endoscopy (colonoscopy or flexible sigmoidoscopy):  Excessive amounts of blood in the stool  Significant tenderness or worsening of abdominal pains  Swelling of the abdomen that is new, acute  Fever of 100F or higher   Following upper endoscopy (EGD)  Vomiting of blood or coffee ground material  New chest pain or pain under the shoulder blades  Painful or persistently difficult swallowing  New shortness of breath  Fever of 100F or higher  Black, tarry-looking stools  For urgent or emergent issues, a gastroenterologist can be reached at any hour by calling [phone]. Do not use MyChart messaging for urgent concerns.    DIET:  We do recommend a small meal at first, but  then you may proceed to your regular diet.  Drink plenty of fluids but you should avoid alcoholic beverages for 24 hours.  ACTIVITY:  You should plan to take it easy for the rest of today and you should NOT DRIVE or use heavy machinery until tomorrow (because of the sedation medicines used during the test).    FOLLOW UP: Our staff will call the number listed on your records 48-72 hours following your procedure to check on you and address any questions or concerns that you may have regarding the information given to you following your procedure. If we do not reach you, we will leave a message.  We will attempt to reach you two times.  During this call, we will ask if you have developed any symptoms of COVID 19. If you develop any symptoms (ie: fever, flu-like symptoms, shortness of breath, cough etc.) before then, please call [phone].  If you test positive for Covid 19 in the 2 weeks post procedure, please call and report this information to us.    If any biopsies were taken you will be contacted by phone or by letter within the next 1-3 weeks.  Please call us at [phone] if you have not heard about the biopsies in 3 weeks.    SIGNATURES/CONFIDENTIALITY: You and/or your care partner have signed paperwork which will be entered into your electronic medical record.  These signatures attest to the fact that that the information above on   your After Visit Summary has been reviewed and is understood.  Full responsibility of the confidentiality of this discharge information lies with you and/or your care-partner. 

## 2021-05-19 NOTE — Op Note (Signed)
[facility] Patient Name: Johnathan DarkMatthew Armstrong Procedure Date: 05/19/2021 3:07 PM MRN: [id number] Endoscopist: Lynann Bolognaajesh Katherleen Folkes , MD Age: 35 Referring MD:  Date of Birth: 11/26/1986 Gender: Male Account #: [id number] Procedure:                Upper GI endoscopy Indications:              Atypical chest pains. Intermittent dysphagia. Medicines:                Monitored Anesthesia Care Procedure:                Pre-Anesthesia Assessment:                           - Prior to the procedure, a History and Physical                            was performed, and patient medications and                            allergies were reviewed. The patient's tolerance of                            previous anesthesia was also reviewed. The risks                            and benefits of the procedure and the sedation                            options and risks were discussed with the patient.                            All questions were answered, and informed consent                            was obtained. Prior Anticoagulants: The patient has                            taken no previous anticoagulant or antiplatelet                            agents. ASA Grade Assessment: II - A patient with                            mild systemic disease. After reviewing the risks                            and benefits, the patient was deemed in                            satisfactory condition to undergo the procedure.                           After obtaining informed consent, the endoscope was  passed under direct vision. Throughout the                            procedure, the patient's blood pressure, pulse, and                            oxygen saturations were monitored continuously. The                            Endoscope was introduced through the mouth, and                            advanced to the second part of duodenum. The upper                            GI endoscopy  was accomplished without difficulty.                            The patient tolerated the procedure well. Scope In: Scope Out: Findings:                 The examined esophagus was normal with well-defined                            Z-line at 42 cm, examined by NBI. Biopsies were                            obtained from the proximal and distal esophagus                            with cold forceps for histology to r/o eosinophilic                            esophagitis. The scope was withdrawn. Dilation was                            performed with a Maloney dilator with mild                            resistance at 50 Fr.                           Localized mild inflammation characterized by                            erythema was found in the gastric antrum. Biopsies                            were taken with a cold forceps for histology.                           One non-bleeding superficial duodenal ulcer with no  stigmata of bleeding was found in the first portion                            of the duodenum. The lesion was 6 mm in largest                            dimension. The second portion of the duodenum was                            normal. Biopsies for histology were taken with a                            cold forceps for evaluation of celiac disease. Complications:            No immediate complications. Estimated Blood Loss:     Estimated blood loss: none. Impression:               - Mild gastritis.                           - Non-bleeding duodenal ulcer with no stigmata of                            bleeding.                           - S/P Empiric esophageal dilatation. Recommendation:           - Patient has a contact number available for                            emergencies. The signs and symptoms of potential                            delayed complications were discussed with the                            patient. Return to normal  activities tomorrow.                            Written discharge instructions were provided to the                            patient.                           - Resume previous diet.                           - Avoid nonsteroidals.                           - Protonix 40 mg p.o. once a day x 8 weeks, then                            stop.                           -  Await pathology results.                           - The findings and recommendations were discussed                            with the patient's family. Lynann Bologna, MD 05/19/2021 3:27:58 PM This report has been signed electronically.

## 2021-05-20 ENCOUNTER — Encounter: Payer: PRIVATE HEALTH INSURANCE | Admitting: Gastroenterology

## 2021-05-21 ENCOUNTER — Telehealth: Payer: Self-pay

## 2021-05-21 NOTE — Telephone Encounter (Signed)
NO ANSWER, MESSAGE LEFT FOR PATIENT. 

## 2021-05-26 ENCOUNTER — Encounter: Payer: Self-pay | Admitting: Gastroenterology

## 2021-05-28 ENCOUNTER — Encounter: Payer: PRIVATE HEALTH INSURANCE | Admitting: Gastroenterology

## 2021-05-29 ENCOUNTER — Other Ambulatory Visit: Payer: Self-pay

## 2021-05-29 ENCOUNTER — Encounter (HOSPITAL_BASED_OUTPATIENT_CLINIC_OR_DEPARTMENT_OTHER): Payer: Self-pay

## 2021-05-29 ENCOUNTER — Emergency Department (HOSPITAL_BASED_OUTPATIENT_CLINIC_OR_DEPARTMENT_OTHER)
Admission: EM | Admit: 2021-05-29 | Discharge: 2021-05-29 | Disposition: A | Discharge status: Home / Self Care | Payer: Self-pay | Attending: Emergency Medicine | Admitting: Emergency Medicine

## 2021-05-29 ENCOUNTER — Emergency Department (HOSPITAL_BASED_OUTPATIENT_CLINIC_OR_DEPARTMENT_OTHER): Payer: Self-pay

## 2021-05-29 DIAGNOSIS — R519 Headache, unspecified: Secondary | ICD-10-CM | POA: Insufficient documentation

## 2021-05-29 DIAGNOSIS — Z87891 Personal history of nicotine dependence: Secondary | ICD-10-CM | POA: Insufficient documentation

## 2021-05-29 MED ORDER — PROCHLORPERAZINE MALEATE 10 MG PO TABS
10.0000 mg | ORAL_TABLET | Freq: Once | ORAL | Status: DC
  Filled 2021-05-29: qty 1

## 2021-05-29 MED ORDER — KETOROLAC TROMETHAMINE 30 MG/ML IJ SOLN
30.0000 mg | Freq: Once | INTRAMUSCULAR | Status: DC
  Filled 2021-05-29: qty 1

## 2021-05-29 MED ORDER — KETOROLAC TROMETHAMINE 30 MG/ML IJ SOLN
30.0000 mg | Freq: Once | INTRAMUSCULAR | Status: AC
  Administered 2021-05-29: 30 mg via INTRAVENOUS

## 2021-05-29 NOTE — ED Provider Notes (Signed)
[facility] EMERGENCY DEPARTMENT Provider Note   CSN: [id number] Arrival date & time: 05/29/21  0856     History Chief Complaint  Patient presents with  . Headache    Johnathan Armstrong is a 35 y.o. male.  Patient presents chief complaint of headache.  Describes it as coming from the back of his head rating to the front of his head.  He states has been having these headaches for over 2 months.  Typically gets it every day or every other day.  It would typically start around 10 AM in the morning and then wax and wane throughout the day.  He is to mention it to his doctor a few times but feels like it has not been fully addressed.  He presents today because for the past 10 days the headache has been more persistent and more intense.  Currently describes as a 5 out of 10 throbbing pain.  He also states that when he moves his head left and right really quickly he feels a headache all of it worse.  He states that at times he feels pain in the back of the neck and feels that it is "stiff."  Currently denies any recent fevers or cough or vomiting or diarrhea.        Past Medical History:  Diagnosis Date  . Ankylosing spondylitis (HCC) 08/28/2013  . GERD (gastroesophageal reflux disease)     Patient Active Problem List   Diagnosis Date Noted  . Chest wall pain 04/03/2019  . Dizziness 02/28/2019  . Acute maxillary sinusitis 02/28/2019  . Palpitations 02/28/2019  . Sinobronchitis 08/13/2018  . 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 Groin pain 01/26/2014  . Hip pain 11/05/2013  . Enthesopathy of hip region 09/23/2013  . Ankylosing spondylitis (HCC) 08/28/2013  . Backache 08/24/2011  . Weight loss 08/24/2011    Past Surgical History:  Procedure Laterality Date  . WISDOM TOOTH EXTRACTION     x5       Family History  Problem Relation Age of Onset  . Hypertension Mother   . Breast cancer Mother   . Prostate cancer Father   .  Lymphoma Father   . Depression Sister   . Asthma Maternal Grandmother   . Hyperlipidemia Maternal Grandmother   . Diabetes Maternal Grandmother   . Hypertension Maternal Grandfather   . Hypertension Paternal Grandmother   . Stroke Paternal Grandmother   . Diabetes Paternal Grandfather   . Colon cancer Neg Hx   . Esophageal cancer Neg Hx   . Rectal cancer Neg Hx   . Stomach cancer Neg Hx     Social History   Tobacco Use  . Smoking status: Never Smoker  . Smokeless tobacco: Former Clinical biochemist  . Vaping Use: Never used  Substance Use Topics  . Alcohol use: Yes    Alcohol/week: 6.0 standard drinks    Types: 6 Standard drinks or equivalent per week    Comment: friday & saturday  . Drug use: Never    Home Medications Prior to Admission medications   Medication Sig Start Date End Date Taking? Authorizing Provider  Cholecalciferol (VITAMIN D3) 250 MCG (10000 UT) capsule Take 10,000 Units by mouth daily.    [provider]  famotidine (PEPCID) 20 MG tablet Take 1 tablet (20 mg total) by mouth daily. 05/04/21   Gailen Shelter, PA  pantoprazole (PROTONIX) 40 MG tablet Take 1 tablet (40 mg total) by mouth daily. Protonix  40 mg po once a day x 8 weeks, then stop. 05/19/21   Lynann Bologna, MD  UNABLE TO FIND Med Name: BPC-157 100 mcg po bid    [provider]    Allergies    Patient has no known allergies.  Review of Systems   Review of Systems  Constitutional: Negative for fever.  HENT: Negative for ear pain and sore throat.   Eyes: Negative for pain.  Respiratory: Negative for cough.   Cardiovascular: Negative for chest pain.  Gastrointestinal: Negative for abdominal pain.  Genitourinary: Negative for flank pain.  Musculoskeletal: Negative for back pain.  Skin: Negative for color change and rash.  Neurological: Negative for syncope.  All other systems reviewed and are negative.   Physical Exam Updated Vital Signs BP (!) 147/92 (BP Location: Right  Arm)   Pulse 78   Temp 98.3 F (36.8 C) (Oral)   Resp 20   Ht 5\' 10"  (1.778 m)   Wt 82.1 kg   SpO2 100%   BMI 25.97 kg/m   Physical Exam Constitutional:      General: He is not in acute distress.    Appearance: He is well-developed.  HENT:     Head: Normocephalic.     Nose: Nose normal.  Eyes:     Extraocular Movements: Extraocular movements intact.  Neck:     Comments: No meningismal signs.  Patient is full range of motion to the left and to the right for flexion and extension.  Neck range of motion appears normal without any pain or discomfort. Cardiovascular:     Rate and Rhythm: Normal rate.  Pulmonary:     Effort: Pulmonary effort is normal.  Skin:    Coloration: Skin is not jaundiced.  Neurological:     General: No focal deficit present.     Mental Status: He is alert and oriented to person, place, and time. Mental status is at baseline.     Cranial Nerves: No cranial nerve deficit.     Sensory: No sensory deficit.     Motor: No weakness.     Coordination: Coordination normal.     Gait: Gait normal.     ED Results / Procedures / Treatments   Labs (all labs ordered are listed, but only abnormal results are displayed) Labs Reviewed - No data to display  EKG None  Radiology CT Head Wo Contrast  Result Date: 05/29/2021 CLINICAL DATA:  Headache for approximately 2 months EXAM: CT HEAD WITHOUT CONTRAST TECHNIQUE: Contiguous axial images were obtained from the base of the skull through the vertex without intravenous contrast. COMPARISON:  None. FINDINGS: Brain: Ventricles and sulci are normal in size and configuration. There is no intracranial mass, hemorrhage, extra-axial fluid collection, or midline shift. Brain parenchyma appears unremarkable. No evident acute infarct. Vascular: No hyperdense vessel.  No evident vascular calcification. Skull: Bony calvarium appears intact. Sinuses/Orbits: Visualized paranasal sinuses are clear. Visualized orbits appear symmetric  bilaterally. Other: Visualized mastoid air cells are clear. IMPRESSION: Study within normal limits. Electronically Signed   By: 07/29/2021 III M.D.   On: 05/29/2021 09:59    Procedures Procedures   Medications Ordered in ED Medications  ketorolac (TORADOL) 30 MG/ML injection 30 mg (30 mg Intravenous Given 05/29/21 07/29/21)    ED Course  I have reviewed the triage vital signs and the nursing notes.  Pertinent labs & imaging results that were available during my care of the patient were reviewed by me and considered in my medical decision  making (see chart for details).    MDM Rules/Calculators/A&P                          Patient presents with headache increasing in intensity been ongoing for the past 2 months.  CT imaging of the brain shows no acute findings.  Patient has no focal neurodeficit at this time and symptoms appear to be at a low level of intensity currently at 5 out of 10.  Given Toradol here in the ER with additional improvement of symptoms.  Will recommend outpatient follow-up with his doctor within the week.  Given a phone number for neurology to follow-up with as well.  Recommending immediate return for worsening symptoms or any additional concerns.  Final Clinical Impression(s) / ED Diagnoses Final diagnoses:  Nonintractable headache, unspecified chronicity pattern, unspecified headache type    Rx / DC Orders ED Discharge Orders    None       Cheryll Cockayne, MD 05/29/21 1015

## 2021-05-29 NOTE — Discharge Instructions (Addendum)
Call your primary care doctor or specialist as discussed in the next 2-3 days.   Return immediately back to the ER if:  Your symptoms worsen within the next 12-24 hours. You develop new symptoms such as new fevers, persistent vomiting, new pain, shortness of breath, or new weakness or numbness, or if you have any other concerns.  

## 2021-05-29 NOTE — ED Triage Notes (Signed)
Pt c/o intermittent HA with associated neck pain x 2 months with some nausea, denies vomiting.

## 2021-06-07 ENCOUNTER — Ambulatory Visit: Payer: PRIVATE HEALTH INSURANCE | Admitting: Gastroenterology

## 2021-07-30 ENCOUNTER — Emergency Department (HOSPITAL_COMMUNITY): Payer: Worker's Compensation

## 2021-07-30 ENCOUNTER — Emergency Department (HOSPITAL_COMMUNITY)
Admission: EM | Admit: 2021-07-30 | Discharge: 2021-07-30 | Disposition: A | Discharge status: Home / Self Care | Payer: Worker's Compensation | Attending: Emergency Medicine | Admitting: Emergency Medicine

## 2021-07-30 ENCOUNTER — Encounter (HOSPITAL_COMMUNITY): Payer: Self-pay | Admitting: Pharmacy Technician

## 2021-07-30 DIAGNOSIS — R202 Paresthesia of skin: Secondary | ICD-10-CM | POA: Diagnosis not present

## 2021-07-30 DIAGNOSIS — S51822A Laceration with foreign body of left forearm, initial encounter: Secondary | ICD-10-CM | POA: Insufficient documentation

## 2021-07-30 DIAGNOSIS — Y990 Civilian activity done for income or pay: Secondary | ICD-10-CM | POA: Insufficient documentation

## 2021-07-30 DIAGNOSIS — S59912A Unspecified injury of left forearm, initial encounter: Secondary | ICD-10-CM | POA: Diagnosis present

## 2021-07-30 DIAGNOSIS — Z87891 Personal history of nicotine dependence: Secondary | ICD-10-CM | POA: Insufficient documentation

## 2021-07-30 DIAGNOSIS — W3189XA Contact with other specified machinery, initial encounter: Secondary | ICD-10-CM | POA: Diagnosis not present

## 2021-07-30 DIAGNOSIS — Z23 Encounter for immunization: Secondary | ICD-10-CM | POA: Insufficient documentation

## 2021-07-30 DIAGNOSIS — S51812A Laceration without foreign body of left forearm, initial encounter: Secondary | ICD-10-CM

## 2021-07-30 MED ORDER — LIDOCAINE-EPINEPHRINE (PF) 2 %-1:200000 IJ SOLN
INTRAMUSCULAR | Status: AC
  Administered 2021-07-30: 10 mL
  Filled 2021-07-30: qty 20

## 2021-07-30 MED ORDER — TETANUS-DIPHTH-ACELL PERTUSSIS 5-2.5-18.5 LF-MCG/0.5 IM SUSY
0.5000 mL | PREFILLED_SYRINGE | Freq: Once | INTRAMUSCULAR | Status: AC
  Administered 2021-07-30: 0.5 mL via INTRAMUSCULAR
  Filled 2021-07-30 (×2): qty 0.5

## 2021-07-30 MED ORDER — LIDOCAINE-EPINEPHRINE (PF) 2 %-1:200000 IJ SOLN
10.0000 mL | Freq: Once | INTRAMUSCULAR | Status: AC
  Filled 2021-07-30: qty 20

## 2021-07-30 NOTE — ED Notes (Signed)
RN cleaned LUE with saline and guaze to remove dried blood. Wrist splint applied to affected site. Patient and family educated on keeping site clean and keeping swelling down. RN informed patient need to return in AM for procedure. Pt and family aware and agreed to plan of care.

## 2021-07-30 NOTE — ED Triage Notes (Signed)
Pt bib ems from work after using angle grinder, which slipped and cut L forearm, approx 2in in length. Bleeding controlled. Numbness noted around area. + pulse distal to injury. VSS with ems.

## 2021-07-30 NOTE — Discharge Instructions (Addendum)
Return to short stay here at [facility] in the morning at 9 am. Do not have anything to eat or drink at midnight.

## 2021-07-30 NOTE — ED Provider Notes (Addendum)
[facility] EMERGENCY DEPARTMENT Provider Note   CSN: [id number] Arrival date & time: 07/30/21  1431     History Chief Complaint  Patient presents with   Laceration    Johnathan Armstrong is a 35 y.o. male.  HPI 35 yo male with laceration to left forearm about 3 hours pte.  Laceration left forearm with some numbness to thumb but no motor deficit.  Last tetanus 5 years ago.  No other medical problems.      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   Groin pain 01/26/2014   Hip pain 11/05/2013   Enthesopathy of hip region 09/23/2013   Ankylosing spondylitis (HCC) 08/28/2013   Backache 08/24/2011   Weight loss 08/24/2011    Past Surgical History:  Procedure Laterality Date   WISDOM TOOTH EXTRACTION     x5       Family History  Problem Relation Age of Onset   Hypertension Mother    Breast cancer Mother    Prostate cancer Father    Lymphoma Father    Depression Sister    Asthma Maternal Grandmother    Hyperlipidemia Maternal Grandmother    Diabetes Maternal Grandmother    Hypertension Maternal Grandfather    Hypertension Paternal Grandmother    Stroke Paternal Grandmother    Diabetes Paternal Grandfather    Colon cancer Neg Hx    Esophageal cancer Neg Hx    Rectal cancer Neg Hx    Stomach cancer Neg Hx     Social History   Tobacco Use   Smoking status: Never   Smokeless tobacco: Former  Building services engineer Use: Never used  Substance Use Topics   Alcohol use: Yes    Alcohol/week: 6.0 standard drinks    Types: 6 Standard drinks or equivalent per week    Comment: friday &  saturday   Drug use: Never    Home Medications Prior to Admission medications   Medication Sig Start Date End Date Taking? Authorizing Provider  Cholecalciferol (VITAMIN D3) 250 MCG (10000 UT) capsule Take 10,000 Units by mouth daily.    [provider]  famotidine (PEPCID) 20 MG tablet Take 1 tablet (20 mg total) by mouth daily. 05/04/21   Gailen Shelter, PA  pantoprazole (PROTONIX) 40 MG tablet Take 1 tablet (40 mg total) by mouth daily. Protonix 40 mg po once a day x 8 weeks, then stop. 05/19/21   Lynann Bologna, MD  UNABLE TO FIND Med Name: BPC-157 100 mcg po bid    [provider]    Allergies    Patient has no known allergies.  Review of Systems   Review of Systems  All other systems reviewed and are negative.  Physical Exam Updated Vital Signs BP (!) 134/96   Pulse 60   Temp 98.5 F (36.9 C) (Oral)   Resp 14   SpO2 100%   Physical Exam Vitals and nursing note reviewed.  HENT:     Head: Normocephalic.     Right Ear: External ear normal.     Left Ear: External ear normal.  Pulmonary:     Effort: No  respiratory distress.  Musculoskeletal:        General: Normal range of motion.     Cervical back: Normal range of motion.     Comments: Decreased strength sensation over radial aspect left thumb  Skin:    General: Skin is warm and dry.     Capillary Refill: Capillary refill takes less than 2 seconds.  Neurological:     Mental Status: He is alert.    ED Results / Procedures / Treatments   Labs (all labs ordered are listed, but only abnormal results are displayed) Labs Reviewed - No data to display  EKG None  Radiology DG Forearm Left  Result Date: 07/30/2021 CLINICAL DATA:  Laceration with a metal grinder to the distal forearm. EXAM: LEFT FOREARM - 2 VIEW COMPARISON:  None. FINDINGS: Significant soft tissue injury involving the distal forearm just proximal to the wrist with gas and radiopaque foreign bodies in the soft tissues. I do not see  any evidence of a bony injury. No cortical fractures or defects are identified. IMPRESSION: Significant soft tissue injury involving the distal forearm just proximal to the wrist with gas and radiopaque foreign bodies in the soft tissues. No underlying bony injury. Electronically Signed   By: Rudie Meyer M.D.   On: 07/30/2021 15:39    Procedures .Marland KitchenLaceration Repair  Date/Time: 07/30/2021 7:03 PM Performed by: Margarita Grizzle, MD Authorized by: Margarita Grizzle, MD   Consent:    Consent obtained:  Verbal   Consent given by:  Patient   Risks, benefits, and alternatives were discussed: yes     Risks discussed:  Infection, retained foreign body, need for additional repair, poor cosmetic result and nerve damage   Alternatives discussed:  No treatment Universal protocol:    Procedure explained and questions answered to patient or proxy's satisfaction: yes     Patient identity confirmed:  Verbally with patient Anesthesia:    Anesthesia method:  Local infiltration   Local anesthetic:  Lidocaine 1% WITH epi Laceration details:    Location:  Shoulder/arm   Shoulder/arm location:  L lower arm   Length (cm):  7 Pre-procedure details:    Preparation:  Patient was prepped and draped in usual sterile fashion and imaging obtained to evaluate for foreign bodies Exploration:    Limited defect created (wound extended): no     Hemostasis achieved with:  Direct pressure   Imaging obtained: x-Rufina Kimery     Imaging outcome: foreign body noted     Wound exploration: wound explored through full range of motion and entire depth of wound visualized     Wound extent: foreign bodies/material     Foreign bodies/material:  Many small alcohol bodies removed   Contaminated: yes   Treatment:    Area cleansed with:  Saline   Amount of cleaning:  Extensive   Irrigation solution:  Sterile saline   Irrigation method:  Syringe   Visualized foreign bodies/material removed: yes     Debridement:  Minimal   Undermining:   None   Scar revision: no   Skin repair:    Repair method:  Sutures   Suture size:  3-0   Suture material:  Prolene   Suture technique:  Simple interrupted   Number of sutures:  7 Approximation:    Approximation:  Loose Post-procedure details:    Dressing:  Splint for protection and bulky dressing   Procedure completion:  Tolerated   Medications Ordered in ED Medications  Tdap (BOOSTRIX) injection 0.5 mL (has no administration in  time range)  lidocaine-EPINEPHrine (XYLOCAINE W/EPI) 2 %-1:200000 (PF) injection 10 mL (has no administration in time range)    ED Course  I have reviewed the triage vital signs and the nursing notes.  Pertinent labs & imaging results that were available during my care of the patient were reviewed by me and considered in my medical decision making (see chart for details).    MDM Rules/Calculators/A&P                           Discussed with Dr.Ortmann  he plans surgery in the morning.  Discussed this with patient and wife.  They voiced understanding.  He will need to be back here at Final Clinical Impression(s) / ED Diagnoses Final diagnoses:  Laceration of left forearm, initial encounter    Rx / DC Orders ED Discharge Orders     None        Margarita Grizzle, MD 07/30/21 Mariana Arn, MD 08/03/21 1550

## 2021-07-30 NOTE — ED Provider Notes (Signed)
Emergency Medicine Provider Triage Evaluation Note  Johnathan Armstrong , a 35 y.o. male  was evaluated in triage.  Pt complains of left forearm laceration.  Laceration occurred just prior to arrival in emergency department.  Patient states that he was caught by a angle grinder accidentally.  Endorses numbness distally from laceration throughout left thumb.  Patient unsure when his last tetanus shot was.  Patient is right-hand dominant.  Review of Systems  Positive: Laceration, numbness Negative: weakness  Physical Exam  BP (!) 134/96   Pulse 60   Temp 98.5 F (36.9 C) (Oral)   Resp 14   SpO2 100%  Gen:   Awake, no distress   Resp:  Normal effort  MSK:   Moves extremities without difficulty, approximately 2 inches laceration to radial aspect of left forearm.  Decreased sensation to light touch distally from throughout entire left thumb.  Cap refill less than 2 seconds in all digits of left hand.  Full range of motion to all digits of left hand. Other:    Medical Decision Making  Medically screening exam initiated at 2:58 PM.  Appropriate orders placed.  Enid Cutter was informed that the remainder of the evaluation will be completed by another provider, this initial triage assessment does not replace that evaluation, and the importance of remaining in the ED until their evaluation is complete.  Patient was offered pain medication but deferred at this time.  The patient appears stable so that the remainder of the work up may be completed by another provider.      Haskel Schroeder, PA-C 07/30/21 1459    Rolan Bucco, MD 07/30/21 [phone]

## 2021-07-31 ENCOUNTER — Encounter (HOSPITAL_COMMUNITY)
Admission: RE | Disposition: A | Discharge status: Home / Self Care | Payer: Self-pay | Source: Home / Self Care | Attending: Orthopedic Surgery

## 2021-07-31 ENCOUNTER — Ambulatory Visit (HOSPITAL_COMMUNITY): Payer: Worker's Compensation | Admitting: Certified Registered"

## 2021-07-31 ENCOUNTER — Ambulatory Visit (HOSPITAL_COMMUNITY): Payer: Worker's Compensation

## 2021-07-31 ENCOUNTER — Encounter (HOSPITAL_COMMUNITY): Payer: Self-pay | Admitting: Orthopedic Surgery

## 2021-07-31 ENCOUNTER — Ambulatory Visit (HOSPITAL_COMMUNITY)
Admission: RE | Admit: 2021-07-31 | Discharge: 2021-07-31 | Disposition: A | Discharge status: Home / Self Care | Payer: Worker's Compensation | Attending: Orthopedic Surgery | Admitting: Orthopedic Surgery

## 2021-07-31 DIAGNOSIS — S5422XA Injury of radial nerve at forearm level, left arm, initial encounter: Secondary | ICD-10-CM | POA: Insufficient documentation

## 2021-07-31 DIAGNOSIS — S56522A Laceration of other extensor muscle, fascia and tendon at forearm level, left arm, initial encounter: Secondary | ICD-10-CM | POA: Diagnosis not present

## 2021-07-31 DIAGNOSIS — S61512A Laceration without foreign body of left wrist, initial encounter: Secondary | ICD-10-CM

## 2021-07-31 DIAGNOSIS — W3189XA Contact with other specified machinery, initial encounter: Secondary | ICD-10-CM | POA: Insufficient documentation

## 2021-07-31 DIAGNOSIS — Y990 Civilian activity done for income or pay: Secondary | ICD-10-CM | POA: Insufficient documentation

## 2021-07-31 DIAGNOSIS — S66822A Laceration of other specified muscles, fascia and tendons at wrist and hand level, left hand, initial encounter: Secondary | ICD-10-CM | POA: Diagnosis not present

## 2021-07-31 DIAGNOSIS — S52302A Unspecified fracture of shaft of left radius, initial encounter for closed fracture: Secondary | ICD-10-CM | POA: Insufficient documentation

## 2021-07-31 DIAGNOSIS — S56322A Laceration of extensor or abductor muscles, fascia and tendons of left thumb at forearm level, initial encounter: Secondary | ICD-10-CM | POA: Diagnosis not present

## 2021-07-31 DIAGNOSIS — S56222A Laceration of other flexor muscle, fascia and tendon at forearm level, left arm, initial encounter: Secondary | ICD-10-CM | POA: Diagnosis not present

## 2021-07-31 DIAGNOSIS — Z79899 Other long term (current) drug therapy: Secondary | ICD-10-CM | POA: Diagnosis not present

## 2021-07-31 HISTORY — PX: WOUND EXPLORATION: SHX6188

## 2021-07-31 LAB — POCT I-STAT, CHEM 8
BUN: 18 mg/dL (ref 6–20)
Calcium, Ion: 1.12 mmol/L — ABNORMAL LOW (ref 1.15–1.40)
Chloride: 108 mmol/L (ref 98–111)
Creatinine, Ser: 1 mg/dL (ref 0.61–1.24)
Glucose, Bld: 95 mg/dL (ref 70–99)
HCT: 44 % (ref 39.0–52.0)
Hemoglobin: 15 g/dL (ref 13.0–17.0)
Potassium: 4.4 mmol/L (ref 3.5–5.1)
Sodium: 140 mmol/L (ref 135–145)
TCO2: 26 mmol/L (ref 22–32)

## 2021-07-31 SURGERY — WOUND EXPLORATION
Anesthesia: General | Laterality: Left

## 2021-07-31 MED ORDER — BUPIVACAINE HCL 0.5 % IJ SOLN
INTRAMUSCULAR | Status: AC
  Filled 2021-07-31: qty 1

## 2021-07-31 MED ORDER — CEPHALEXIN 500 MG PO CAPS: 500.0000 mg | ORAL_CAPSULE | Freq: Four times a day (QID) | ORAL | 0 refills | Status: AC

## 2021-07-31 MED ORDER — MEPERIDINE HCL 25 MG/ML IJ SOLN: 6.2500 mg | INTRAMUSCULAR | Status: DC | PRN

## 2021-07-31 MED ORDER — PROPOFOL 10 MG/ML IV BOLUS
INTRAVENOUS | Status: DC | PRN
  Administered 2021-07-31: 290 mg via INTRAVENOUS

## 2021-07-31 MED ORDER — FENTANYL CITRATE (PF) 250 MCG/5ML IJ SOLN
INTRAMUSCULAR | Status: AC
  Filled 2021-07-31: qty 5

## 2021-07-31 MED ORDER — DEXAMETHASONE SODIUM PHOSPHATE 10 MG/ML IJ SOLN
INTRAMUSCULAR | Status: DC | PRN
  Administered 2021-07-31: 10 mg via INTRAVENOUS

## 2021-07-31 MED ORDER — CEFAZOLIN SODIUM-DEXTROSE 2-3 GM-%(50ML) IV SOLR
INTRAVENOUS | Status: DC | PRN
  Administered 2021-07-31: 2 g via INTRAVENOUS

## 2021-07-31 MED ORDER — CHLORHEXIDINE GLUCONATE 0.12 % MT SOLN: 15.0000 mL | Freq: Once | OROMUCOSAL | Status: AC

## 2021-07-31 MED ORDER — LIDOCAINE HCL (CARDIAC) PF 100 MG/5ML IV SOSY
PREFILLED_SYRINGE | INTRAVENOUS | Status: DC | PRN
  Administered 2021-07-31: 100 mg via INTRAVENOUS

## 2021-07-31 MED ORDER — ONDANSETRON HCL 4 MG/2ML IJ SOLN
INTRAMUSCULAR | Status: DC | PRN
  Administered 2021-07-31: 4 mg via INTRAVENOUS

## 2021-07-31 MED ORDER — KETOROLAC TROMETHAMINE 30 MG/ML IJ SOLN: 30.0000 mg | Freq: Once | INTRAMUSCULAR | Status: DC | PRN

## 2021-07-31 MED ORDER — FENTANYL CITRATE (PF) 250 MCG/5ML IJ SOLN
INTRAMUSCULAR | Status: DC | PRN
  Administered 2021-07-31: 25 ug via INTRAVENOUS
  Administered 2021-07-31: 100 ug via INTRAVENOUS

## 2021-07-31 MED ORDER — OXYCODONE HCL 5 MG/5ML PO SOLN: 5.0000 mg | Freq: Once | ORAL | Status: DC | PRN

## 2021-07-31 MED ORDER — 0.9 % SODIUM CHLORIDE (POUR BTL) OPTIME
TOPICAL | Status: DC | PRN
  Administered 2021-07-31: 1000 mL

## 2021-07-31 MED ORDER — HYDROMORPHONE HCL 1 MG/ML IJ SOLN: 0.2500 mg | INTRAMUSCULAR | Status: DC | PRN

## 2021-07-31 MED ORDER — CHLORHEXIDINE GLUCONATE 0.12 % MT SOLN
OROMUCOSAL | Status: AC
  Administered 2021-07-31: 15 mL via OROMUCOSAL
  Filled 2021-07-31: qty 15

## 2021-07-31 MED ORDER — MIDAZOLAM HCL 2 MG/2ML IJ SOLN
INTRAMUSCULAR | Status: DC | PRN
  Administered 2021-07-31: 2 mg via INTRAVENOUS

## 2021-07-31 MED ORDER — OXYCODONE HCL 5 MG PO TABS
5.0000 mg | ORAL_TABLET | Freq: Once | ORAL | Status: DC | PRN
Start: 2021-07-31 — End: 2021-07-31

## 2021-07-31 MED ORDER — DEXAMETHASONE SODIUM PHOSPHATE 10 MG/ML IJ SOLN
INTRAMUSCULAR | Status: AC
  Filled 2021-07-31: qty 1

## 2021-07-31 MED ORDER — ONDANSETRON HCL 4 MG/2ML IJ SOLN
INTRAMUSCULAR | Status: AC
  Filled 2021-07-31: qty 2

## 2021-07-31 MED ORDER — LACTATED RINGERS IV SOLN: INTRAVENOUS | Status: DC

## 2021-07-31 MED ORDER — BUPIVACAINE HCL (PF) 0.5 % IJ SOLN
INTRAMUSCULAR | Status: DC | PRN
  Administered 2021-07-31: 9 mL

## 2021-07-31 MED ORDER — PROPOFOL 10 MG/ML IV BOLUS
INTRAVENOUS | Status: AC
  Filled 2021-07-31: qty 40

## 2021-07-31 MED ORDER — HYDROCODONE-ACETAMINOPHEN 5-325 MG PO TABS: 1.0000 | ORAL_TABLET | ORAL | 0 refills | Status: AC | PRN

## 2021-07-31 MED ORDER — PROMETHAZINE HCL 25 MG/ML IJ SOLN: 6.2500 mg | INTRAMUSCULAR | Status: DC | PRN

## 2021-07-31 MED ORDER — CEFAZOLIN SODIUM-DEXTROSE 2-4 GM/100ML-% IV SOLN
INTRAVENOUS | Status: AC
  Filled 2021-07-31: qty 100

## 2021-07-31 MED ORDER — LACTATED RINGERS IV SOLN: INTRAVENOUS | Status: DC | PRN

## 2021-07-31 MED ORDER — BUPIVACAINE-EPINEPHRINE (PF) 0.25% -1:200000 IJ SOLN
INTRAMUSCULAR | Status: AC
  Filled 2021-07-31: qty 30

## 2021-07-31 MED ORDER — MIDAZOLAM HCL 2 MG/2ML IJ SOLN
INTRAMUSCULAR | Status: AC
  Filled 2021-07-31: qty 2

## 2021-07-31 MED ORDER — ORAL CARE MOUTH RINSE: 15.0000 mL | Freq: Once | OROMUCOSAL | Status: AC

## 2021-07-31 SURGICAL SUPPLY — 34 items
BAG COUNTER SPONGE SURGICOUNT (BAG) ×2 IMPLANT
BAG SPNG CNTER NS LX DISP (BAG) ×1
BNDG ELASTIC 3X5.8 VLCR STR LF (GAUZE/BANDAGES/DRESSINGS) ×1 IMPLANT
BNDG ELASTIC 4X5.8 VLCR STR LF (GAUZE/BANDAGES/DRESSINGS) ×2 IMPLANT
BNDG GAUZE ELAST 4 BULKY (GAUZE/BANDAGES/DRESSINGS) ×2 IMPLANT
CUFF TOURN SGL QUICK 18X4 (TOURNIQUET CUFF) ×2 IMPLANT
DRAPE SURG 17X11 SM STRL (DRAPES) ×4 IMPLANT
DRSG ADAPTIC 3X8 NADH LF (GAUZE/BANDAGES/DRESSINGS) ×1 IMPLANT
DRSG EMULSION OIL 3X3 NADH (GAUZE/BANDAGES/DRESSINGS) ×2 IMPLANT
DRSG PAD ABDOMINAL 8X10 ST (GAUZE/BANDAGES/DRESSINGS) ×2 IMPLANT
ELECT REM PT RETURN 9FT ADLT (ELECTROSURGICAL) ×2
ELECTRODE REM PT RTRN 9FT ADLT (ELECTROSURGICAL) ×1 IMPLANT
GAUZE SPONGE 4X4 12PLY STRL (GAUZE/BANDAGES/DRESSINGS) ×2 IMPLANT
GAUZE SPONGE 4X4 12PLY STRL LF (GAUZE/BANDAGES/DRESSINGS) ×1 IMPLANT
GLOVE SURG ORTHO LTX SZ8 (GLOVE) ×2 IMPLANT
GOWN STRL REUS W/ TWL XL LVL3 (GOWN DISPOSABLE) ×1 IMPLANT
GOWN STRL REUS W/TWL XL LVL3 (GOWN DISPOSABLE) ×2
IV NS IRRIG 3000ML ARTHROMATIC (IV SOLUTION) ×2 IMPLANT
KIT BASIN OR (CUSTOM PROCEDURE TRAY) ×2 IMPLANT
MANIFOLD NEPTUNE II (INSTRUMENTS) ×2 IMPLANT
PACK ORTHO EXTREMITY (CUSTOM PROCEDURE TRAY) ×2 IMPLANT
PAD CAST 4YDX4 CTTN HI CHSV (CAST SUPPLIES) ×1 IMPLANT
PADDING CAST COTTON 4X4 STRL (CAST SUPPLIES) ×2
SET CYSTO W/LG BORE CLAMP LF (SET/KITS/TRAYS/PACK) ×2 IMPLANT
SOAP 2 % CHG 4 OZ (WOUND CARE) ×2 IMPLANT
SPLINT FIBERGLASS 3X12 (CAST SUPPLIES) ×1 IMPLANT
SUT PROLENE 3 0 PS 2 (SUTURE) IMPLANT
SUT VIC AB 1 CT1 27 (SUTURE)
SUT VIC AB 1 CT1 27XBRD ANTBC (SUTURE) IMPLANT
SUT VIC AB 2-0 CT1 27 (SUTURE)
SUT VIC AB 2-0 CT1 27XBRD (SUTURE) IMPLANT
SYR 20ML LL LF (SYRINGE) ×2 IMPLANT
SYR CONTROL 10ML LL (SYRINGE) ×2 IMPLANT
TOWEL GREEN STERILE (TOWEL DISPOSABLE) ×2 IMPLANT

## 2021-07-31 NOTE — Transfer of Care (Signed)
Immediate Anesthesia Transfer of Care Note  Patient: Johnathan Armstrong  Procedure(s) Performed: FOREARM WOUND EXPLORATION AND REPAIR (Left)  Patient Location: PACU  Anesthesia Type:General  Level of Consciousness: awake, alert  and oriented  Airway & Oxygen Therapy: Patient Spontanous Breathing  Post-op Assessment: Report given to RN and Post -op Vital signs reviewed and stable  Post vital signs: Reviewed and stable  Last Vitals:  Vitals Value Taken Time  BP 142/92 07/31/21 1538  Temp    Pulse 72 07/31/21 1539  Resp 12 07/31/21 1539  SpO2 100 % 07/31/21 1539  Vitals shown include unvalidated device data.  Last Pain:  Vitals:   07/31/21 0930  TempSrc: Oral  PainSc: 7       Patients Stated Pain Goal: 3 (07/31/21 0930)  Complications: No notable events documented.

## 2021-07-31 NOTE — Discharge Instructions (Addendum)
KEEP BANDAGE CLEAN AND DRY CALL OFFICE FOR F/U APPT [id number] in 13 days KEEP HAND ELEVATED ABOVE HEART OK TO APPLY ICE TO OPERATIVE AREA CONTACT OFFICE IF ANY WORSENING PAIN OR CONCERNS.  

## 2021-07-31 NOTE — Anesthesia Preprocedure Evaluation (Signed)
Anesthesia Evaluation  Patient identified by MRN, date of birth, ID band  Reviewed: Allergy & Precautions, NPO status , Patient's Chart, lab work & pertinent test results  Airway Mallampati: I  TM Distance: >3 FB Neck ROM: Full    Dental no notable dental hx. (+) Dental Advisory Given   Pulmonary neg pulmonary ROS,    Pulmonary exam normal breath sounds clear to auscultation       Cardiovascular negative cardio ROS Normal cardiovascular exam Rhythm:Regular Rate:Normal     Neuro/Psych negative neurological ROS     GI/Hepatic Neg liver ROS, GERD  ,  Endo/Other  negative endocrine ROS  Renal/GU negative Renal ROS     Musculoskeletal  (+) Arthritis ,   Abdominal   Peds  Hematology negative hematology ROS (+)   Anesthesia Other Findings   Reproductive/Obstetrics                             Anesthesia Physical Anesthesia Plan  ASA: 1  Anesthesia Plan: General   Post-op Pain Management:    Induction: Intravenous  PONV Risk Score and Plan: 3 and Ondansetron, Dexamethasone, Treatment may vary due to age or medical condition and Midazolam  Airway Management Planned: LMA  Additional Equipment:   Intra-op Plan:   Post-operative Plan: Extubation in OR  Informed Consent: I have reviewed the patients History and Physical, chart, labs and discussed the procedure including the risks, benefits and alternatives for the proposed anesthesia with the patient or authorized representative who has indicated his/her understanding and acceptance.     Dental advisory given  Plan Discussed with: CRNA  Anesthesia Plan Comments:         Anesthesia Quick Evaluation

## 2021-07-31 NOTE — Anesthesia Procedure Notes (Signed)
Procedure Name: LMA Insertion Date/Time: 07/31/2021 1:57 PM Performed by: Jodell Cipro, CRNA Pre-anesthesia Checklist: Patient identified, Emergency Drugs available, Suction available and Patient being monitored Patient Re-evaluated:Patient Re-evaluated prior to induction Oxygen Delivery Method: Circle System Utilized Preoxygenation: Pre-oxygenation with 100% oxygen Induction Type: IV induction Ventilation: Mask ventilation without difficulty LMA: LMA inserted LMA Size: 5.0 Number of attempts: 1 Airway Equipment and Method: Bite block Placement Confirmation: positive ETCO2 Tube secured with: Tape Dental Injury: Teeth and Oropharynx as per pre-operative assessment

## 2021-07-31 NOTE — H&P (Signed)
Johnathan Armstrong is an 35 y.o. male.   Chief Complaint: Left forearm laceration HPI: Patient is a right-hand-dominant gentleman who was at work and sustained the injury to his left forearm while working on a grinder.  Patient presented to the ER with a laceration with the deficits directly over the superficial branch of the radial nerve as well as metallic fragments within the wound.  Patient is here for surgery for wound exploration nerve and tendon repair and foreign body removal.  Past Medical History:  Diagnosis Date   Ankylosing spondylitis (HCC) 08/28/2013   GERD (gastroesophageal reflux disease)     Past Surgical History:  Procedure Laterality Date   WISDOM TOOTH EXTRACTION     x5    Family History  Problem Relation Age of Onset   Hypertension Mother    Breast cancer Mother    Prostate cancer Father    Lymphoma Father    Depression Sister    Asthma Maternal Grandmother    Hyperlipidemia Maternal Grandmother    Diabetes Maternal Grandmother    Hypertension Maternal Grandfather    Hypertension Paternal Grandmother    Stroke Paternal Grandmother    Diabetes Paternal Grandfather    Colon cancer Neg Hx    Esophageal cancer Neg Hx    Rectal cancer Neg Hx    Stomach cancer Neg Hx    Social History:  reports that he has never smoked. He has quit using smokeless tobacco. He reports current alcohol use of about 6.0 standard drinks of alcohol per week. He reports that he does not use drugs.  Allergies: No Known Allergies  Medications Prior to Admission  Medication Sig Dispense Refill   magnesium 30 MG tablet Take 30 mg by mouth 2 (two) times daily.     Cholecalciferol (VITAMIN D3) 250 MCG (10000 UT) capsule Take 10,000 Units by mouth daily.     famotidine (PEPCID) 20 MG tablet Take 1 tablet (20 mg total) by mouth daily. 30 tablet 0   pantoprazole (PROTONIX) 40 MG tablet Take 1 tablet (40 mg total) by mouth daily. Protonix 40 mg po once a day x 8 weeks, then stop. 90 tablet 3    UNABLE TO FIND Med Name: BPC-157 100 mcg po bid      Results for orders placed or performed during the hospital encounter of 07/31/21 (from the past 48 hour(s))  I-STAT, chem 8     Status: Abnormal   Collection Time: 07/31/21  9:41 AM  Result Value Ref Range   Sodium 140 135 - 145 mmol/L   Potassium 4.4 3.5 - 5.1 mmol/L   Chloride 108 98 - 111 mmol/L   BUN 18 6 - 20 mg/dL   Creatinine, Ser 0.15 0.61 - 1.24 mg/dL   Glucose, Bld 95 70 - 99 mg/dL    Comment: Glucose reference range applies only to samples taken after fasting for at least 8 hours.   Calcium, Ion 1.12 (L) 1.15 - 1.40 mmol/L   TCO2 26 22 - 32 mmol/L   Hemoglobin 15.0 13.0 - 17.0 g/dL   HCT 61.5 37.9 - 43.2 %   DG Forearm Left  Result Date: 07/30/2021 CLINICAL DATA:  Laceration with a metal grinder to the distal forearm. EXAM: LEFT FOREARM - 2 VIEW COMPARISON:  None. FINDINGS: Significant soft tissue injury involving the distal forearm just proximal to the wrist with gas and radiopaque foreign bodies in the soft tissues. I do not see any evidence of a bony injury. No cortical fractures or  defects are identified. IMPRESSION: Significant soft tissue injury involving the distal forearm just proximal to the wrist with gas and radiopaque foreign bodies in the soft tissues. No underlying bony injury. Electronically Signed   By: Rudie Meyer M.D.   On: 07/30/2021 15:39   DG MINI C-ARM IMAGE ONLY  Result Date: 07/31/2021 There is no interpretation for this exam.  This order is for images obtained during a surgical procedure.  Please See "Surgeries" Tab for more information regarding the procedure.    ROS no cardiac pulmonary renal GI neurologic complaints.  Blood pressure 123/73, pulse 70, temperature 98.3 F (36.8 C), temperature source Oral, resp. rate 18, height 5\' 10"  (1.778 m), weight 84.4 kg, SpO2 100 %. Physical Exam  General Appearance:  Alert, cooperative, no distress, appears stated age  Head:  Normocephalic, without  obvious abnormality, atraumatic  Eyes:  Pupils equal, conjunctiva/corneas clear,         Throat: Lips, mucosa, and tongue normal; teeth and gums normal  Neck: No visible masses     Lungs:   respirations unlabored  Chest Wall:  No tenderness or deformity  Heart:  Regular rate and rhythm,  Abdomen:   Soft, non-tender,         Extremities: Patient has the bandage in place.  He is able to extend his thumb and extend his digits his fingertips are warm well perfused good digital flexion.  He does have the absence of sensation to light touch along the superficial branch of the radial nerve distribution within the first webspace  Pulses: 2+ and symmetric  Skin: Skin color, texture, turgor normal, no rashes or lesions     Neurologic: Normal     Assessment/Plan Left forearm laceration with tendon and nerve involvement  Left forearm wound exploration and repair as indicated.  Patient does have the laceration with the nerve deficits.  We talked about the reason rationale for wound exploration and repair.  The patient would like to proceed.  R/B/A DISCUSSED WITH PT IN OFFICE.  PT VOICED UNDERSTANDING OF PLAN CONSENT SIGNED DAY OF SURGERY PT SEEN AND EXAMINED PRIOR TO OPERATIVE PROCEDURE/DAY OF SURGERY SITE MARKED. QUESTIONS ANSWERED WILL GO HOME FOLLOWING SURGERY   WE ARE PLANNING SURGERY FOR YOUR UPPER EXTREMITY. THE RISKS AND BENEFITS OF SURGERY INCLUDE BUT NOT LIMITED TO BLEEDING INFECTION, DAMAGE TO NEARBY NERVES ARTERIES TENDONS, FAILURE OF SURGERY TO ACCOMPLISH ITS INTENDED GOALS, PERSISTENT SYMPTOMS AND NEED FOR FURTHER SURGICAL INTERVENTION. WITH THIS IN MIND WE WILL PROCEED. I HAVE DISCUSSED WITH THE PATIENT THE PRE AND POSTOPERATIVE REGIMEN AND THE DOS AND DON'TS. PT VOICED UNDERSTANDING AND INFORMED CONSENT SIGNED.   [facility] 07/31/2021, 11:24 AM

## 2021-07-31 NOTE — Op Note (Signed)
PREOPERATIVE DIAGNOSIS: Left forearm laceration 7 cm with tendon and nerve involvement  POSTOPERATIVE DIAGNOSIS: Same  ATTENDING SURGEON: Dr. Bradly Bienenstock who scrubbed and present for the entire procedure  ASSISTANT SURGEON: None  ANESTHESIA: General via LMA  OPERATIVE PROCEDURE: Excisional debridement of skin subcutaneous tissue associated with contaminated grinder injury with multiple retained metallic fragments Open treatment of left radial shaft fracture without internal fixation Repair left forearm brachioradialis tendon Repair left forearm abductor pollicis longus Repair left forearm abductor pollicis longus digastric Repair left forearm extensor pollicis brevis tendon to the thumb Repair left forearm extensor carpi radialis longus to the wrist Repair superficial branch of the radial nerve left forearm Autologous vein wrapping left superficial branch of the radial nerve Traumatic laceration repair 7 cm  IMPLANTS: None  EBL: Minimal  RADIOGRAPHIC INTERPRETATION: None  SURGICAL INDICATIONS: Patient is a right-hand-dominant gentleman who was at work and sustained the open laceration to the left forearm.  Patient was seen and evaluated and recommended to undergo the above procedure.  The risks of surgery include but not limited to bleeding infection damage nearby nerves arteries or tendons loss of motion of the wrist and digits incomplete relief of symptoms and need for further surgical intervention.  SURGICAL TECHNIQUE: Patient was palpated via the preoperative holding area marked apart a marker made the left forearm indicate correct operative site.  Patient brought back the operating placed supine on the anesthesia table where the general anesthetic was administered.  Patient tolerated this well.  Well-padded tourniquet placed on the left brachium and sealed with the appropriate drape.  The left upper extremities then prepped and draped normal sterile fashion.  A timeout was called  the correct site was identified procedure then begun.  Attention was then turned to the left forearm the previous laceration 7 cm was extended proximally distally.  Excisional debridement was then carried out.  Debridement type: Excisional Debridement  Side: left  Body Location: Left Forearm   Tools used for debridement: scalpel, scissors, and rongeur  Pre-debridement Wound size (cm):   Length:7       Width: 2  Depth: 3   Post-debridement Wound size (cm):   Length: 8       Width: 3    Depth: 3   Debridement depth beyond dead/damaged tissue down to healthy viable tissue: yes  Tissue layer involved: skin, subcutaneous tissue, muscle / fascia, bone  Nature of tissue removed: Devitalized Tissue  Irrigation volume: 1 Liter  Irrigation fluid type: Normal Saline   Multiple loose metallic fragments were then removed within the region.  Excisional debridement was carried all the way down to the bone.  The patient did have the cortical disruption of the fracture to the radial shaft this was nondisplaced.  Open treatment of the radial shaft fracture was done without internal fixation. After excisional debridement attention was then turned to repair.  Patient did have the near complete laceration to the ECR L.  This was then repaired with 3-0 FiberWire figure-of-eight and horizontal mattress sutures.  A total of 8 strands across the repair.  Following this repair in similar fashion was then done of the brachial radialis.  Partial laceration to the brachial radialis and 6 strands of the 3-0 FiberWire were then used in a figure-of-eight fashion.  After repair of this tendon attention was then turned to the first dorsal compartment.  The patient had multiple slips of the abductor pollicis longus.  2 separate tendons were identified of the abductor pollicis longus.  These  were both repaired with 3-0 FiberWire horizontal mattress and figure-of-eight sutures.  Both tendons were repaired to the abductor.  Once  these were carried out 4-0 FiberWire was then used to repair the extensor pollicis brevis.  This is a sick strand repair of the EPB.  After repair of the EPB careful nerve dissection was then done of the superficial branch of the radial nerve.  The nerve ends were then carefully identified.  Under loupe magnification the nerve was then repaired epineurial sutures then placed 7-0 nylon sutures were placed on the volar and dorsal margins of the epineurium.  This brought the nerve ends nicely together.  Given where the laceration was and the proximity to the repair and the FiberWire suture a vein graft was then harvested in the field.  The vein was then opened up and then placed around the nerve to serve as a autologous vein wrap and nerve protector.  This was sewn in with 8-0 nylon suture.  The wound was then thoroughly irrigated.  Skin was then closed and traumatic laceration was then closed with horizontal mattress and simple Prolene sutures.  Adaptic dressing a sterile compressive bandage then applied.  The patient was placed in a well-padded thumb spica splint extubated taken recovery room in good condition.  10 cc of quarter percent Marcaine infiltrated locally before the patient was extubated.   POSTOPERATIVE PLAN: Patient be discharged to home.  See him back in the office in 13 days for wound check suture removal x-rays of the wrist down to see our therapist to begin a postoperative APL and EPB repair protocol.  Out of work for the first 2 weeks.

## 2021-08-02 ENCOUNTER — Encounter (HOSPITAL_COMMUNITY): Payer: Self-pay | Admitting: Orthopedic Surgery

## 2021-08-03 NOTE — Anesthesia Postprocedure Evaluation (Signed)
Anesthesia Post Note  Patient: Johnathan Armstrong  Procedure(s) Performed: FOREARM WOUND EXPLORATION AND REPAIR (Left)     Patient location during evaluation: PACU Anesthesia Type: General Level of consciousness: sedated and patient cooperative Pain management: pain level controlled Vital Signs Assessment: post-procedure vital signs reviewed and stable Respiratory status: spontaneous breathing Cardiovascular status: stable Anesthetic complications: no   No notable events documented.  Last Vitals:  Vitals:   07/31/21 1553 07/31/21 1608  BP: (!) 136/99 (!) 135/103  Pulse: 77 82  Resp: 15 12  Temp:  (!) 36.4 C  SpO2: 100% 100%    Last Pain:  Vitals:   07/31/21 1608  TempSrc:   PainSc: 0-No pain                 Lewie Loron

## 2021-09-14 ENCOUNTER — Ambulatory Visit (INDEPENDENT_AMBULATORY_CARE_PROVIDER_SITE_OTHER): Payer: PRIVATE HEALTH INSURANCE | Admitting: Physician Assistant

## 2021-09-14 ENCOUNTER — Encounter: Payer: Self-pay | Admitting: Physician Assistant

## 2021-09-14 ENCOUNTER — Telehealth: Payer: Self-pay | Admitting: Cardiovascular Disease

## 2021-09-14 ENCOUNTER — Other Ambulatory Visit: Payer: Self-pay

## 2021-09-14 VITALS — BP 134/86 | HR 72 | Ht 70.0 in | Wt 185.8 lb

## 2021-09-14 DIAGNOSIS — R079 Chest pain, unspecified: Secondary | ICD-10-CM | POA: Diagnosis not present

## 2021-09-14 DIAGNOSIS — R011 Cardiac murmur, unspecified: Secondary | ICD-10-CM | POA: Diagnosis not present

## 2021-09-14 DIAGNOSIS — R002 Palpitations: Secondary | ICD-10-CM | POA: Diagnosis not present

## 2021-09-14 DIAGNOSIS — Z79899 Other long term (current) drug therapy: Secondary | ICD-10-CM

## 2021-09-14 LAB — COMPREHENSIVE METABOLIC PANEL
ALT: 14 IU/L (ref 0–44)
AST: 25 IU/L (ref 0–40)
Albumin/Globulin Ratio: 1.9 (ref 1.2–2.2)
Albumin: 5 g/dL (ref 4.0–5.0)
Alkaline Phosphatase: 86 IU/L (ref 44–121)
BUN/Creatinine Ratio: 13 (ref 9–20)
BUN: 14 mg/dL (ref 6–20)
Bilirubin Total: 0.4 mg/dL (ref 0.0–1.2)
CO2: 25 mmol/L (ref 20–29)
Calcium: 10 mg/dL (ref 8.7–10.2)
Chloride: 100 mmol/L (ref 96–106)
Creatinine, Ser: 1.08 mg/dL (ref 0.76–1.27)
Globulin, Total: 2.6 g/dL (ref 1.5–4.5)
Glucose: 99 mg/dL (ref 65–99)
Potassium: 4.5 mmol/L (ref 3.5–5.2)
Sodium: 140 mmol/L (ref 134–144)
Total Protein: 7.6 g/dL (ref 6.0–8.5)
eGFR: 91 mL/min/{1.73_m2} (ref >59)

## 2021-09-14 LAB — CBC
Hematocrit: 46.3 % (ref 37.5–51.0)
Hemoglobin: 15.6 g/dL (ref 13.0–17.7)
MCH: 28.1 pg (ref 26.6–33.0)
MCHC: 33.7 g/dL (ref 31.5–35.7)
MCV: 83 fL (ref 79–97)
Platelets: 258 10*3/uL (ref 150–450)
RBC: 5.56 x10E6/uL (ref 4.14–5.80)
RDW: 11.7 % (ref 11.6–15.4)
WBC: 7.1 10*3/uL (ref 3.4–10.8)

## 2021-09-14 LAB — LIPID PANEL
Chol/HDL Ratio: 3.5 ratio (ref 0.0–5.0)
Cholesterol, Total: 288 mg/dL — ABNORMAL HIGH (ref 100–199)
HDL: 83 mg/dL (ref >39)
LDL Chol Calc (NIH): 182 mg/dL — ABNORMAL HIGH (ref 0–99)
Triglycerides: 130 mg/dL (ref 0–149)
VLDL Cholesterol Cal: 23 mg/dL (ref 5–40)

## 2021-09-14 LAB — TSH: TSH: 0.917 u[IU]/mL (ref 0.450–4.500)

## 2021-09-14 NOTE — Telephone Encounter (Signed)
See Mychart message.  Pt scheduled to see Jacolyn Reedy, PA-C today at 9:15am.

## 2021-09-14 NOTE — Telephone Encounter (Signed)
Pt states 10-20 mins after leaving our office he could feel his heart fluttering.  HR jumped up to 120.  Lasted 10-15 mins and came back down to 90.  Currently in the 80s and fluttering resolved.  Advised pt to continue with plan for monitor as this will show [place] what is going on when he is feeling that fluttering.  Advised GXT also maybe able to pick up on it if it occurs during stress.  Pt would like to move echo up if possible.  Advised we don't have anything sooner here but I could have scheduling team check with [facility], [facility] or [facility] to see if we can get done sooner.  Pt agreeable to this.

## 2021-09-14 NOTE — Progress Notes (Signed)
Cardiology Office Note    Date:  09/14/2021   ID:  Johnathan Armstrong, DOB 09-02-1986, MRN [id number]   PCP:  Johnathan Armstrong  Cardiologist:  Johnathan Miss, MD  Advanced Practice Provider:  No care team member to display Electrophysiologist:  None   [id number]   Chief Complaint  Patient presents with   Palpitations    History of Present Illness:  Johnathan Armstrong is a 35 y.o. male with history of chest pain and palpitations. Last had telemedicine visit with Dr. Elease Armstrong 04/08/19 and pain was felt to be MSK and referred to PT for strength training.  Patient added onto my schedule for palpitations. Has a jumping sensation, heart fluttering in his chest 6 am while doing some light exercises. Stopped exercising after 5 min. His stomach got upset he thinks because he got nervous.  It eased off when he laid down but returned when he tried to eat breakfast. He added baking soda to 64 ounces of water last night for indigestion. Has had palpitations on occasion by doesn't last. His apple watch showed PVC's. He's been doing 20 min interval training and walking. Has had some pressure in his chest that's constant and constant belching worse with exercise.Had gastritis and a small duodenal ulcer on endo in May. Johnathan Armstrong and Johnathan Armstrong had strokes. Never smoked. Drinks 7 drinks/week. Drink 1/2 caffeine coffee on occasion. Eats healthy. No HTN, DM, or HLD. 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     Past Medical History:  Diagnosis Date   Ankylosing spondylitis (HCC) 08/28/2013   GERD (gastroesophageal reflux disease)     Past Surgical History:  Procedure Laterality Date   WISDOM TOOTH EXTRACTION     x5   WOUND EXPLORATION Left 07/31/2021   Procedure: FOREARM WOUND EXPLORATION AND REPAIR;  Surgeon: Johnathan Bienenstock, MD;  Location: [facility] OR;  Service: Orthopedics;  Laterality: Left;    Current Medications: No outpatient medications have been  marked as taking for the 09/14/21 encounter (Office Visit) with Johnathan Kief, PA-C.     Allergies:   Patient has no known allergies.   Social History   Socioeconomic History   Marital status: Single    Spouse name: Not on file   Number of children: Not on file   Years of education: Not on file   Highest education level: Not on file  Occupational History   Not on file  Tobacco Use   Smoking status: Never   Smokeless tobacco: Former  Building services engineer Use: Never used  Substance and Sexual Activity   Alcohol use: Yes    Alcohol/week: 6.0 standard drinks    Types: 6 Standard drinks or equivalent per week    Comment: friday & saturday   Drug use: Never   Sexual activity: Not on file  Other Topics Concern   Not on file  Social History Narrative   Not on file   Social Determinants of Health   Financial Resource Strain: Not on file  Food Insecurity: Not on file  Transportation Needs: Not on file  Physical Activity: Not on file  Stress: Not on file  Social Connections: Not on file     Family History:  The patient's  family history includes Asthma in his maternal grandmother; Breast cancer in his mother; Depression in his sister; Diabetes in his maternal grandmother and paternal grandfather; Hyperlipidemia in his maternal grandmother; Hypertension  in his maternal grandfather, mother, and paternal grandmother; Lymphoma in his father; Prostate cancer in his father; Stroke in his paternal grandmother.   ROS:   Please see the history of present illness.    ROS All other systems reviewed and are negative.   PHYSICAL EXAM:   VS:  BP 134/86   Pulse 72   Ht 5\' 10"  (1.778 m)   Wt 185 lb 12.8 oz (84.3 kg)   SpO2 95%   BMI 26.66 kg/m   Physical Exam  GEN: Well nourished, well developed, in no acute distress  Neck: no JVD, carotid bruits, or masses Cardiac:RRR; 1/6 systolic murmur apex Respiratory:  clear to auscultation bilaterally, normal work of breathing GI: soft,  nontender, nondistended, + BS Ext: without cyanosis, clubbing, or edema, Good distal pulses bilaterally Neuro:  Alert and Oriented x 3 Psych: euthymic mood, full affect  Wt Readings from Last 3 Encounters:  09/14/21 185 lb 12.8 oz (84.3 kg)  07/31/21 186 lb (84.4 kg)  05/29/21 181 lb (82.1 kg)      Studies/Labs Reviewed:   EKG:  EKG is  ordered today.  The ekg ordered today demonstrates NSR, IRBBB, repolarization changes unchanged from prior tracings  Recent Labs: 05/04/2021: ALT 21; Platelets 273 07/31/2021: BUN 18; Creatinine, Ser 1.00; Hemoglobin 15.0; Potassium 4.4; Sodium 140   Lipid Panel No results found for: CHOL, TRIG, HDL, CHOLHDL, VLDL, LDLCALC, LDLDIRECT  Additional studies/ records that were reviewed today include:      Risk Assessment/Calculations:         ASSESSMENT:    1. Palpitations   2. Chest pain, unspecified type   3. Heart murmur   4. Medication management      PLAN:  In order of problems listed above:  Palpitations-PVC's documented on his apple watch. 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 Will check GXT  Heart murmur-sounds benign but will check echo  Shared Decision Making/Informed Consent   Shared Decision Making/Informed Consent The risks [chest pain, shortness of breath, cardiac arrhythmias, dizziness, blood pressure fluctuations, myocardial infarction, stroke/transient ischemic attack, and life-threatening complications (estimated to be 1 in 10,000)], benefits (risk stratification, diagnosing coronary artery disease, treatment guidance) and alternatives of an exercise tolerance test were discussed in detail with Mr. Johnathan Armstrong and he agrees to proceed.    Medication Adjustments/Labs and Tests Ordered: Current medicines are reviewed at length with the patient today.  Concerns regarding medicines are outlined above.  Medication changes, Labs and Tests ordered today are listed in the  Patient Instructions below. Patient Instructions  Medication Instructions:  Your physician recommends that you continue on your current medications as directed. Please refer to the Current Medication list given to you today.  *If you need a refill on your cardiac medications before your next appointment, please call your pharmacy*   Lab Work: Cmp, Lipids, Cbc, Tsh- Today   If you have labs (blood work) drawn today and your tests are completely normal, you will receive your results only by: MyChart Message (if you have MyChart) OR A paper copy in the mail If you have any lab test that is abnormal or we need to change your treatment, we will call you to review the results.   Testing/Procedures: Your physician has requested that you have an echocardiogram. Echocardiography is a painless test that uses sound waves to create images of your heart. It provides your doctor with information about the size and shape of your  heart and how well your heart's chambers and valves are working. This procedure takes approximately one hour. There are no restrictions for this procedure.  A zio monitor was ordered today. It will remain on for 14 days. You will then return monitor and event diary in provided box. It takes 1-2 weeks for report to be downloaded and returned to [place]. We will call you with the results. If monitor falls off or has orange flashing light, please call [employer] for further instructions.   Your physician has requested that you have an exercise tolerance test. For further information please visit [email or web address] Please also follow instruction sheet, as given.   Follow-Up: Follow up with Jacolyn Reedy, PA-C after testing    Other Instructions ZIO XT- Long Term Monitor Instructions  Your physician has requested you wear a ZIO patch monitor for 14 days.  This is a single patch monitor. [employer] supplies one patch monitor per enrollment. Additional stickers are not available. Please do  not apply patch if you will be having a Nuclear Stress Test,  Echocardiogram, Cardiac CT, MRI, or Chest Xray during the period you would be wearing the  monitor. The patch cannot be worn during these tests. You cannot remove and re-apply the  ZIO XT patch monitor.  Your ZIO patch monitor will be mailed 3 day USPS to your address on file. It may take 3-5 days  to receive your monitor after you have been enrolled.  Once you have received your monitor, please review the enclosed instructions. Your monitor  has already been registered assigning a specific monitor serial # to you.  Billing and Patient Assistance Program Information  We have supplied [employer] with any of your insurance information on file for billing purposes. [employer] offers a sliding scale Patient Assistance Program for patients that do not have  insurance, or whose insurance does not completely cover the cost of the ZIO monitor.  You must apply for the Patient Assistance Program to qualify for this discounted rate.  To apply, please call [employer] at [phone], select option 4, select option 2, ask to apply for  Patient Assistance Program. Meredeth Ide will ask your household income, and how many people  are in your household. They will quote your out-of-pocket cost based on that information.  [employer] will also be able to set up a 85-month, interest-free payment plan if needed.  Applying the monitor  Shave hair from upper left chest.  Hold abrader disc by orange tab. Rub abrader in 40 strokes over the upper left chest as  indicated in your monitor instructions.  Clean area with 4 enclosed alcohol pads. Let dry.  Apply patch as indicated in monitor instructions. Patch will be placed under collarbone on left  side of chest with arrow pointing upward.  Rub patch adhesive wings for 2 minutes. Remove white label marked "1". Remove the white  label marked "2". Rub patch adhesive wings for 2 additional minutes.  While looking in a  mirror, press and release button in center of patch. A small green light will  flash 3-4 times. This will be your only indicator that the monitor has been turned on.  Do not shower for the first 24 hours. You may shower after the first 24 hours.  Press the button if you feel a symptom. You will hear a small click. Record Date, Time and  Symptom in the Patient Logbook.  When you are ready to remove the patch, follow instructions on the last 2 pages of Patient  Logbook. Stick patch monitor onto the last page of Patient Logbook.  Place Patient Logbook in the blue and white box. Use locking tab on box and tape box closed  securely. The blue and white box has prepaid postage on it. Please place it in the mailbox as  soon as possible. Your physician should have your test results approximately 7 days after the  monitor has been mailed back to [facility].  Call [facility] Customer Care at [phone] if you have questions regarding  your ZIO XT patch monitor. Call them immediately if you see an orange light blinking on your  monitor.  If your monitor falls off in less than 4 days, contact our Monitor department at [phone].  If your monitor becomes loose or falls off after 4 days call [employer] at [phone] for  suggestions on securing your monitor    Signed, Jacolyn Reedy, Cordelia Poche  09/14/2021 10:07 AM    [facility] [address] [place], [place], [place]  [address] Phone: [phone]; Fax: [phone]

## 2021-09-14 NOTE — Telephone Encounter (Signed)
Patient c/o Palpitations:  High priority if patient c/o lightheadedness, shortness of breath, or chest pain  How long have you had palpitations/irregular HR/ Afib? Are you having the symptoms now?  Patient states he has been having palpitations nonstop for the past 2 hours. Still having symptoms currently, but they aren't as bad because he is laying down.  Are you currently experiencing lightheadedness, SOB or CP?  No   Do you have a history of afib (atrial fibrillation) or irregular heart rhythm?  No   Have you checked your BP or HR? (document readings if available):  118/77  85  Are you experiencing any other symptoms?  Diarrhea, nausea

## 2021-09-14 NOTE — Patient Instructions (Signed)
Medication Instructions:  Your physician recommends that you continue on your current medications as directed. Please refer to the Current Medication list given to you today.  *If you need a refill on your cardiac medications before your next appointment, please call your pharmacy*   Lab Work: Cmp, Lipids, Cbc, Tsh- Today   If you have labs (blood work) drawn today and your tests are completely normal, you will receive your results only by: MyChart Message (if you have MyChart) OR A paper copy in the mail If you have any lab test that is abnormal or we need to change your treatment, we will call you to review the results.   Testing/Procedures: Your physician has requested that you have an echocardiogram. Echocardiography is a painless test that uses sound waves to create images of your heart. It provides your doctor with information about the size and shape of your heart and how well your heart's chambers and valves are working. This procedure takes approximately one hour. There are no restrictions for this procedure.  A zio monitor was ordered today. It will remain on for 14 days. You will then return monitor and event diary in provided box. It takes 1-2 weeks for report to be downloaded and returned to [place]. We will call you with the results. If monitor falls off or has orange flashing light, please call Zio for further instructions.   Your physician has requested that you have an exercise tolerance test. For further information please visit [email or web address] Please also follow instruction sheet, as given.   Follow-Up: Follow up with Jacolyn Reedy, PA-C after testing    Other Instructions ZIO XT- Long Term Monitor Instructions  Your physician has requested you wear a ZIO patch monitor for 14 days.  This is a single patch monitor. [employer] supplies one patch monitor per enrollment. Additional stickers are not available. Please do not apply patch if you will be having a Nuclear Stress  Test,  Echocardiogram, Cardiac CT, MRI, or Chest Xray during the period you would be wearing the  monitor. The patch cannot be worn during these tests. You cannot remove and re-apply the  ZIO XT patch monitor.  Your ZIO patch monitor will be mailed 3 day USPS to your address on file. It may take 3-5 days  to receive your monitor after you have been enrolled.  Once you have received your monitor, please review the enclosed instructions. Your monitor  has already been registered assigning a specific monitor serial # to you.  Billing and Patient Assistance Program Information  We have supplied [employer] with any of your insurance information on file for billing purposes. [employer] offers a sliding scale Patient Assistance Program for patients that do not have  insurance, or whose insurance does not completely cover the cost of the ZIO monitor.  You must apply for the Patient Assistance Program to qualify for this discounted rate.  To apply, please call [employer] at [phone], select option 4, select option 2, ask to apply for  Patient Assistance Program. Meredeth Ide will ask your household income, and how many people  are in your household. They will quote your out-of-pocket cost based on that information.  [employer] will also be able to set up a 45-month, interest-free payment plan if needed.  Applying the monitor  Shave hair from upper left chest.  Hold abrader disc by orange tab. Rub abrader in 40 strokes over the upper left chest as  indicated in your monitor instructions.  Clean area with 4 enclosed alcohol  pads. Let dry.  Apply patch as indicated in monitor instructions. Patch will be placed under collarbone on left  side of chest with arrow pointing upward.  Rub patch adhesive wings for 2 minutes. Remove white label marked "1". Remove the white  label marked "2". Rub patch adhesive wings for 2 additional minutes.  While looking in a mirror, press and release button in center of patch. A  small green light will  flash 3-4 times. This will be your only indicator that the monitor has been turned on.  Do not shower for the first 24 hours. You may shower after the first 24 hours.  Press the button if you feel a symptom. You will hear a small click. Record Date, Time and  Symptom in the Patient Logbook.  When you are ready to remove the patch, follow instructions on the last 2 pages of Patient  Logbook. Stick patch monitor onto the last page of Patient Logbook.  Place Patient Logbook in the blue and white box. Use locking tab on box and tape box closed  securely. The blue and white box has prepaid postage on it. Please place it in the mailbox as  soon as possible. Your physician should have your test results approximately 7 days after the  monitor has been mailed back to [facility].  Call [facility] Customer Care at [phone] if you have questions regarding  your ZIO XT patch monitor. Call them immediately if you see an orange light blinking on your  monitor.  If your monitor falls off in less than 4 days, contact our Monitor department at [phone].  If your monitor becomes loose or falls off after 4 days call [employer] at [phone] for  suggestions on securing your monitor

## 2021-09-14 NOTE — Telephone Encounter (Signed)
STAT if HR is under 50 or over 120 (normal HR is 60-100 beats per minute)  What is your heart rate?  90 currently  Do you have a log of your heart rate readings (document readings)?  Patient states his HR was 120 for about 10 minutes   Do you have any other symptoms?  No additional symptoms. Patient is extremely concerned. He states as soon as he left the office his HR went back up. He states it was 120 for about 10 minutes, but now it's back down to 90.

## 2021-09-14 NOTE — Addendum Note (Signed)
Addended by: Julio Sicks on: 09/14/2021 04:47 PM   Modules accepted: Orders

## 2021-09-15 ENCOUNTER — Ambulatory Visit (INDEPENDENT_AMBULATORY_CARE_PROVIDER_SITE_OTHER): Payer: PRIVATE HEALTH INSURANCE

## 2021-09-15 DIAGNOSIS — R011 Cardiac murmur, unspecified: Secondary | ICD-10-CM

## 2021-09-15 LAB — ECHOCARDIOGRAM COMPLETE
Area-P 1/2: 3.99 cm2
S' Lateral: 3.36 cm

## 2021-09-16 ENCOUNTER — Other Ambulatory Visit: Payer: Self-pay

## 2021-09-16 ENCOUNTER — Other Ambulatory Visit (INDEPENDENT_AMBULATORY_CARE_PROVIDER_SITE_OTHER): Payer: PRIVATE HEALTH INSURANCE

## 2021-09-16 ENCOUNTER — Ambulatory Visit (INDEPENDENT_AMBULATORY_CARE_PROVIDER_SITE_OTHER): Payer: PRIVATE HEALTH INSURANCE

## 2021-09-16 DIAGNOSIS — R002 Palpitations: Secondary | ICD-10-CM

## 2021-09-16 DIAGNOSIS — R079 Chest pain, unspecified: Secondary | ICD-10-CM | POA: Diagnosis not present

## 2021-09-16 LAB — EXERCISE TOLERANCE TEST
Angina Index: 0
Duke Treadmill Score: 15
Estimated workload: 19
Exercise duration (min): 15 min
Exercise duration (sec): 0 s
MPHR: 184 {beats}/min
Peak HR: 187 {beats}/min
Percent HR: 101 %
RPE: 19
Rest HR: 67 {beats}/min
ST Depression (mm): 0 mm

## 2021-09-16 NOTE — Progress Notes (Unsigned)
Office inventory [phone] given to patient

## 2021-09-17 DIAGNOSIS — R002 Palpitations: Secondary | ICD-10-CM

## 2021-10-01 ENCOUNTER — Other Ambulatory Visit (HOSPITAL_COMMUNITY): Payer: PRIVATE HEALTH INSURANCE

## 2021-10-05 ENCOUNTER — Ambulatory Visit: Payer: PRIVATE HEALTH INSURANCE | Admitting: Cardiovascular Disease

## 2021-11-05 ENCOUNTER — Other Ambulatory Visit: Payer: Self-pay

## 2021-11-05 ENCOUNTER — Ambulatory Visit (INDEPENDENT_AMBULATORY_CARE_PROVIDER_SITE_OTHER): Payer: PRIVATE HEALTH INSURANCE | Admitting: Cardiovascular Disease

## 2021-11-05 ENCOUNTER — Encounter: Payer: Self-pay | Admitting: Cardiovascular Disease

## 2021-11-05 VITALS — BP 102/74 | HR 63 | Ht 70.0 in | Wt 187.0 lb

## 2021-11-05 DIAGNOSIS — I493 Ventricular premature depolarization: Secondary | ICD-10-CM

## 2021-11-05 NOTE — Patient Instructions (Signed)
Medication Instructions:  Your physician recommends that you continue on your current medications as directed. Please refer to the Current Medication list given to you today.  *If you need a refill on your cardiac medications before your next appointment, please call your pharmacy*   Lab Work: None ordered.  If you have labs (blood work) drawn today and your tests are completely normal, you will receive your results only by: MyChart Message (if you have MyChart) OR A paper copy in the mail If you have any lab test that is abnormal or we need to change your treatment, we will call you to review the results.   Testing/Procedures: None ordered.    Follow-Up: At [facility], you and your health needs are our priority.  As part of our continuing mission to provide you with exceptional heart care, we have created designated Provider Care Teams.  These Care Teams include your primary Cardiologist (physician) and Advanced Practice Providers (APPs -  Physician Assistants and Nurse Practitioners) who all work together to provide you with the care you need, when you need it.  We recommend signing up for the patient portal called "MyChart".  Sign up information is provided on this After Visit Summary.  MyChart is used to connect with patients for Virtual Visits (Telemedicine).  Patients are able to view lab/test results, encounter notes, upcoming appointments, etc.  Non-urgent messages can be sent to your provider as well.   To learn more about what you can do with MyChart, go to [email or web address].    Your next appointment:   Follow up with Dr Elease Hashimoto as needed

## 2021-11-05 NOTE — Progress Notes (Signed)
Cardiology Office Note:    Date:  11/06/2021   ID:  Johnathan Armstrong, DOB 08-21-1986, MRN [id number]  PCP:  Melina Fiddler   [facility] Providers Cardiologist:  Kristeen Miss, MD {    Referring MD: No ref. provider found   Chief Complaint  Patient presents with   Chest Pain     Nov. 11, 2022   Johnathan Armstrong is a 35 y.o. male with a hx of chest wall pain/atypical chest pain.  I saw him via telemedicine visit in April, 2020.  He has recently developed some palpitations.  He was seen by Wende Bushy, PA on September 14, 2021.  ZIO event monitor was ordered.  Zio  monitor revealed occasional premature ventricular contractions but no serious arrhythmias. Echo shows normal LV function , no significant valvular disease Normal GXT .   He thinks the PVCs are related to his GERD.  Has a gastric ulcer - takes pantoprozole   Had an accident with a metal cut off wheel on left hand ( 07/30/21) Almost lost his Left hand.     Past Medical History:  Diagnosis Date   Ankylosing spondylitis (HCC) 08/28/2013   GERD (gastroesophageal reflux disease)     Past Surgical History:  Procedure Laterality Date   WISDOM TOOTH EXTRACTION     x5   WOUND EXPLORATION Left 07/31/2021   Procedure: FOREARM WOUND EXPLORATION AND REPAIR;  Surgeon: Bradly Bienenstock, MD;  Location: [facility] OR;  Service: Orthopedics;  Laterality: Left;    Current Medications: Current Meds  Medication Sig   Cholecalciferol (VITAMIN D3) 250 MCG (10000 UT) capsule Take 10,000 Units by mouth daily.   famotidine (PEPCID) 20 MG tablet Take 1 tablet (20 mg total) by mouth daily.   magnesium 30 MG tablet Take 30 mg by mouth 2 (two) times daily.   pantoprazole (PROTONIX) 40 MG tablet Take 1 tablet (40 mg total) by mouth daily. Protonix 40 mg po once a day x 8 weeks, then stop.   UNABLE TO FIND Med Name: BPC-157 100 mcg po bid     Allergies:   Patient has no known allergies.   Social History   Socioeconomic History    Marital status: Single    Spouse name: Not on file   Number of children: Not on file   Years of education: Not on file   Highest education level: Not on file  Occupational History   Not on file  Tobacco Use   Smoking status: Never   Smokeless tobacco: Former  Building services engineer Use: Never used  Substance and Sexual Activity   Alcohol use: Yes    Alcohol/week: 6.0 standard drinks    Types: 6 Standard drinks or equivalent per week    Comment: friday & saturday   Drug use: Never   Sexual activity: Not on file  Other Topics Concern   Not on file  Social History Narrative   Not on file   Social Determinants of Health   Financial Resource Strain: Not on file  Food Insecurity: Not on file  Transportation Needs: Not on file  Physical Activity: Not on file  Stress: Not on file  Social Connections: Not on file     Family History: The patient's family history includes Asthma in his maternal grandmother; Breast cancer in his mother; Depression in his sister; Diabetes in his maternal grandmother and paternal grandfather; Hyperlipidemia in his maternal grandmother; Hypertension in his maternal grandfather, mother, and paternal grandmother; Lymphoma in  his father; Prostate cancer in his father; Stroke in his paternal grandmother. There is no history of Colon cancer, Esophageal cancer, Rectal cancer, or Stomach cancer.  ROS:   Please see the history of present illness.     All other systems reviewed and are negative.  EKGs/Labs/Other Studies Reviewed:    The following studies were reviewed today:   EKG:     Recent Labs: 09/14/2021: ALT 14; BUN 14; Creatinine, Ser 1.08; Hemoglobin 15.6; Platelets 258; Potassium 4.5; Sodium 140; TSH 0.917  Recent Lipid Panel    Component Value Date/Time   CHOL 288 (H) 09/14/2021 1014   TRIG 130 09/14/2021 1014   HDL 83 09/14/2021 1014   CHOLHDL 3.5 09/14/2021 1014   LDLCALC 182 (H) 09/14/2021 1014     Risk Assessment/Calculations:           Physical Exam:    VS:  BP 102/74   Pulse 63   Ht 5\' 10"  (1.778 m)   Wt 187 lb (84.8 kg)   SpO2 98%   BMI 26.83 kg/m     Wt Readings from Last 3 Encounters:  11/05/21 187 lb (84.8 kg)  09/14/21 185 lb 12.8 oz (84.3 kg)  07/31/21 186 lb (84.4 kg)     GEN:  Well nourished, well developed in no acute distress HEENT: Normal NECK: No JVD; No carotid bruits LYMPHATICS: No lymphadenopathy CARDIAC: irreg. Irreg.  RESPIRATORY:  Clear to auscultation without rales, wheezing or rhonchi  ABDOMEN: Soft, non-tender, non-distended MUSCULOSKELETAL:  No edema; No deformity  SKIN: Warm and dry NEUROLOGIC:  Alert and oriented x 3 PSYCHIATRIC:  Normal affect   ASSESSMENT:    1. PVC's (premature ventricular contractions)    PLAN:    In order of problems listed above:  PVCs  - has intermittant PVCs   Has several aggravating factors  His palpitations are stable.   I have explained the relative benign nature of these PVCs.     At this point, he is very stable .  We will see him on a PRN basis.   Advised continued good diet.  Exercise           Medication Adjustments/Labs and Tests Ordered: Current medicines are reviewed at length with the patient today.  Concerns regarding medicines are outlined above.  No orders of the defined types were placed in this encounter.  No orders of the defined types were placed in this encounter.    Patient Instructions  Medication Instructions:  Your physician recommends that you continue on your current medications as directed. Please refer to the Current Medication list given to you today.  *If you need a refill on your cardiac medications before your next appointment, please call your pharmacy*   Lab Work: None ordered.  If you have labs (blood work) drawn today and your tests are completely normal, you will receive your results only by: MyChart Message (if you have MyChart) OR A paper copy in the mail If you have any lab test that is  abnormal or we need to change your treatment, we will call you to review the results.   Testing/Procedures: None ordered.    Follow-Up: At [facility], you and your health needs are our priority.  As part of our continuing mission to provide you with exceptional heart care, we have created designated Provider Care Teams.  These Care Teams include your primary Cardiologist (physician) and Advanced Practice Providers (APPs -  Physician Assistants and Nurse Practitioners) who all work together to  provide you with the care you need, when you need it.  We recommend signing up for the patient portal called "MyChart".  Sign up information is provided on this After Visit Summary.  MyChart is used to connect with patients for Virtual Visits (Telemedicine).  Patients are able to view lab/test results, encounter notes, upcoming appointments, etc.  Non-urgent messages can be sent to your provider as well.   To learn more about what you can do with MyChart, go to [email or web address].    Your next appointment:   Follow up with Dr Elease Hashimoto as needed   Signed, Kristeen Miss, MD  11/06/2021 6:22 AM    [facility]

## 2021-11-16 ENCOUNTER — Telehealth: Payer: Self-pay

## 2021-11-16 NOTE — Telephone Encounter (Signed)
-----   Message from Dyann Kief, PA-C sent at 11/10/2021  1:06 PM EST ----- Just discussed his lipid panel with Dr.Nahser who he saw recently and agrees with referal to Dr. Rennis Golden for familial hyperlipidemia work up. Can you please call patient to see if he's agreeable? Thanks

## 2021-11-16 NOTE — Telephone Encounter (Signed)
Pt has reviewed labs and provider comments on his MyChart.  Spoke with pt who declines referral to Lipid clinic with Dr Rennis Golden.  Pt is agreeable to dietary changes and repeating labs.  Pt had to hang up and did not have time to schedule repeat lab appointments due to son not feeling well.

## 2022-12-29 ENCOUNTER — Encounter: Payer: Self-pay | Admitting: Cardiovascular Disease

## 2023-07-20 IMAGING — DX DG FOREARM 2V*L*
2 series · 2 of 2 positions shown · non-contrast
Comparison: None.

CLINICAL DATA: Laceration with a metal grinder to the distal
forearm.

EXAM:
LEFT FOREARM - 2 VIEW

[forearm ap]
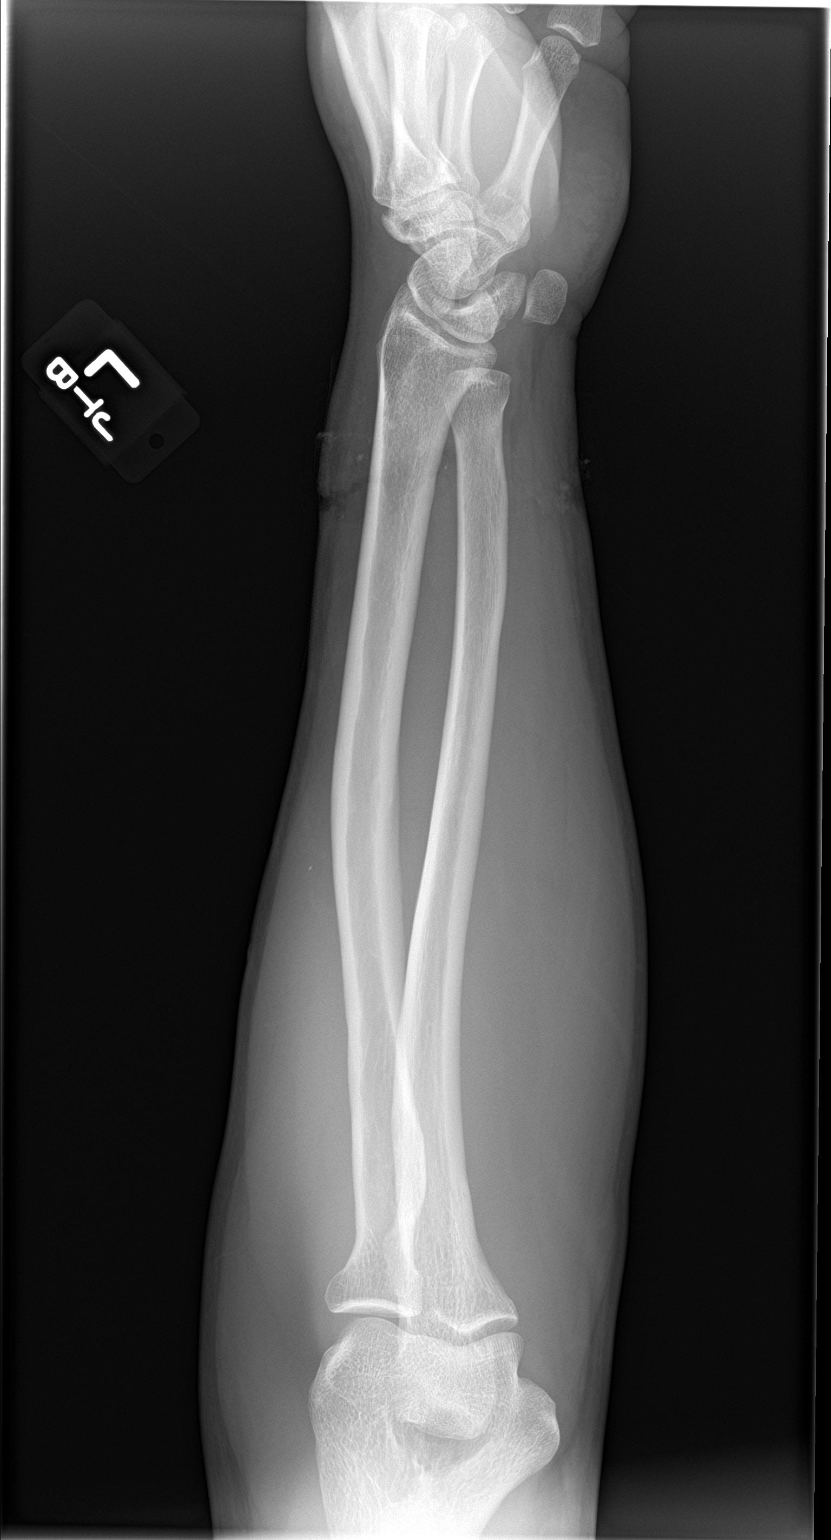

[forearm lat]
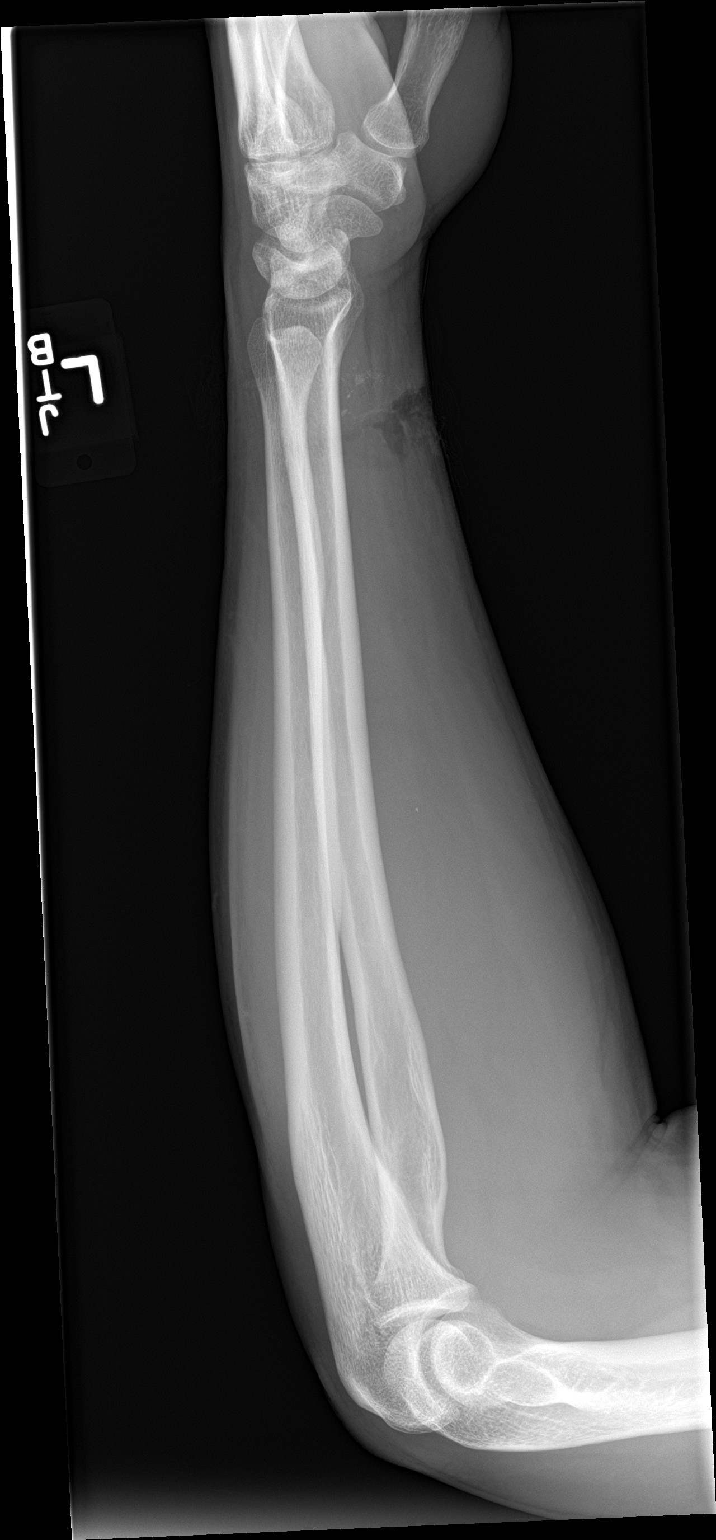

[2 of 2 positions shown; findings below may reference images not displayed]

FINDINGS: Significant soft tissue injury involving the distal forearm just
proximal to the wrist with gas and radiopaque foreign bodies in the
soft tissues. I do not see any evidence of a bony injury. No
cortical fractures or defects are identified.
IMPRESSION: Significant soft tissue injury involving the distal forearm just
proximal to the wrist with gas and radiopaque foreign bodies in the
soft tissues. No underlying bony injury.
